# Patient Record
Sex: Male | Born: 1985 | Race: Black or African American | Hispanic: No | Marital: Single | Smoking: Former smoker
Health system: Southern US, Community
[De-identification: ages and names within clinical notes are randomized; demographics above are authoritative.]

## PROBLEM LIST (undated history)

## (undated) DIAGNOSIS — F332 Major depressive disorder, recurrent severe without psychotic features: Secondary | ICD-10-CM

---

## 2006-02-06 ENCOUNTER — Emergency Department: Payer: Self-pay | Admitting: Emergency Medicine

## 2007-12-27 ENCOUNTER — Emergency Department: Payer: Self-pay | Admitting: Emergency Medicine

## 2009-07-11 ENCOUNTER — Emergency Department: Payer: Self-pay | Admitting: Emergency Medicine

## 2009-07-20 ENCOUNTER — Emergency Department: Payer: Self-pay | Admitting: Internal Medicine

## 2009-09-27 ENCOUNTER — Emergency Department: Payer: Self-pay | Admitting: Internal Medicine

## 2010-08-02 ENCOUNTER — Emergency Department: Payer: Self-pay | Admitting: Emergency Medicine

## 2011-01-24 ENCOUNTER — Emergency Department: Payer: Self-pay | Admitting: Emergency Medicine

## 2011-04-25 ENCOUNTER — Emergency Department: Payer: Self-pay | Admitting: Emergency Medicine

## 2011-07-21 ENCOUNTER — Inpatient Hospital Stay: Payer: Self-pay | Admitting: Psychiatry

## 2011-08-05 ENCOUNTER — Emergency Department: Payer: Self-pay | Admitting: Emergency Medicine

## 2012-09-10 ENCOUNTER — Emergency Department: Payer: Self-pay | Admitting: Emergency Medicine

## 2014-09-11 ENCOUNTER — Emergency Department: Payer: Self-pay | Admitting: Emergency Medicine

## 2015-03-03 ENCOUNTER — Encounter: Payer: Self-pay | Admitting: Emergency Medicine

## 2015-03-03 ENCOUNTER — Emergency Department
Admission: EM | Admit: 2015-03-03 | Discharge: 2015-03-03 | Disposition: A | Discharge status: Home / Self Care | Payer: Managed Care, Other (non HMO) | Attending: Emergency Medicine | Admitting: Emergency Medicine

## 2015-03-03 DIAGNOSIS — R202 Paresthesia of skin: Secondary | ICD-10-CM | POA: Insufficient documentation

## 2015-03-03 DIAGNOSIS — R51 Headache: Secondary | ICD-10-CM | POA: Diagnosis present

## 2015-03-03 LAB — URINALYSIS COMPLETE WITH MICROSCOPIC (ARMC ONLY)
Bacteria, UA: NONE SEEN
Bilirubin Urine: NEGATIVE
GLUCOSE, UA: NEGATIVE mg/dL
Hgb urine dipstick: NEGATIVE
Ketones, ur: NEGATIVE mg/dL
Leukocytes, UA: NEGATIVE
Nitrite: NEGATIVE
Protein, ur: NEGATIVE mg/dL
RBC / HPF: NONE SEEN RBC/hpf (ref 0–5)
SQUAMOUS EPITHELIAL / LPF: NONE SEEN
Specific Gravity, Urine: 1.03 (ref 1.005–1.030)
pH: 6 (ref 5.0–8.0)

## 2015-03-03 LAB — COMPREHENSIVE METABOLIC PANEL
ALBUMIN: 4.5 g/dL (ref 3.5–5.0)
ALK PHOS: 80 U/L (ref 38–126)
ALT: 31 U/L (ref 17–63)
AST: 27 U/L (ref 15–41)
Anion gap: 6 (ref 5–15)
BILIRUBIN TOTAL: 0.6 mg/dL (ref 0.3–1.2)
BUN: 15 mg/dL (ref 6–20)
CALCIUM: 9.1 mg/dL (ref 8.9–10.3)
CHLORIDE: 105 mmol/L (ref 101–111)
CO2: 28 mmol/L (ref 22–32)
Creatinine, Ser: 1.24 mg/dL (ref 0.61–1.24)
GFR calc Af Amer: >60 mL/min (ref >60)
GLUCOSE: 97 mg/dL (ref 65–99)
Potassium: 3.9 mmol/L (ref 3.5–5.1)
SODIUM: 139 mmol/L (ref 135–145)
Total Protein: 8.3 g/dL — ABNORMAL HIGH (ref 6.5–8.1)

## 2015-03-03 LAB — CBC
HCT: 46.7 % (ref 40.0–52.0)
Hemoglobin: 16 g/dL (ref 13.0–18.0)
MCH: 31.1 pg (ref 26.0–34.0)
MCHC: 34.2 g/dL (ref 32.0–36.0)
MCV: 90.7 fL (ref 80.0–100.0)
PLATELETS: 271 10*3/uL (ref 150–440)
RBC: 5.14 MIL/uL (ref 4.40–5.90)
RDW: 12.5 % (ref 11.5–14.5)
WBC: 6.6 10*3/uL (ref 3.8–10.6)

## 2015-03-03 NOTE — ED Notes (Signed)
Patient to ED with report of headache for about last 2 hours, patient reports he was sitting at a show and couldn't stay awake with numbness and tingling in his hands and fingers. Patient reports no history of headaches.

## 2015-03-03 NOTE — ED Notes (Signed)
Pt called x1 for room, no answer  ?

## 2015-03-11 ENCOUNTER — Encounter: Payer: Self-pay | Admitting: Emergency Medicine

## 2015-03-11 ENCOUNTER — Emergency Department
Admission: EM | Admit: 2015-03-11 | Discharge: 2015-03-12 | Disposition: A | Discharge status: Home / Self Care | Payer: Managed Care, Other (non HMO) | Attending: Emergency Medicine | Admitting: Emergency Medicine

## 2015-03-11 ENCOUNTER — Emergency Department: Payer: Managed Care, Other (non HMO)

## 2015-03-11 DIAGNOSIS — Y998 Other external cause status: Secondary | ICD-10-CM | POA: Insufficient documentation

## 2015-03-11 DIAGNOSIS — Y9389 Activity, other specified: Secondary | ICD-10-CM | POA: Insufficient documentation

## 2015-03-11 DIAGNOSIS — Z23 Encounter for immunization: Secondary | ICD-10-CM | POA: Diagnosis not present

## 2015-03-11 DIAGNOSIS — W109XXA Fall (on) (from) unspecified stairs and steps, initial encounter: Secondary | ICD-10-CM | POA: Insufficient documentation

## 2015-03-11 DIAGNOSIS — Y92009 Unspecified place in unspecified non-institutional (private) residence as the place of occurrence of the external cause: Secondary | ICD-10-CM | POA: Diagnosis not present

## 2015-03-11 DIAGNOSIS — S0181XA Laceration without foreign body of other part of head, initial encounter: Secondary | ICD-10-CM | POA: Diagnosis not present

## 2015-03-11 DIAGNOSIS — S0990XA Unspecified injury of head, initial encounter: Secondary | ICD-10-CM

## 2015-03-11 DIAGNOSIS — S0083XA Contusion of other part of head, initial encounter: Secondary | ICD-10-CM

## 2015-03-11 MED ORDER — TRAMADOL HCL 50 MG PO TABS: 50.0000 mg | ORAL_TABLET | Freq: Three times a day (TID) | ORAL | Status: DC | PRN

## 2015-03-11 MED ORDER — TETANUS-DIPHTH-ACELL PERTUSSIS 5-2.5-18.5 LF-MCG/0.5 IM SUSP
0.5000 mL | Freq: Once | INTRAMUSCULAR | Status: AC
  Administered 2015-03-11: 0.5 mL via INTRAMUSCULAR

## 2015-03-11 MED ORDER — BACITRACIN ZINC 500 UNIT/GM EX OINT
TOPICAL_OINTMENT | CUTANEOUS | Status: AC
  Administered 2015-03-11: 1
  Filled 2015-03-11: qty 0.9

## 2015-03-11 MED ORDER — TRAMADOL HCL 50 MG PO TABS
50.0000 mg | ORAL_TABLET | Freq: Once | ORAL | Status: AC
  Administered 2015-03-11: 50 mg via ORAL

## 2015-03-11 MED ORDER — LIDOCAINE-EPINEPHRINE-TETRACAINE (LET) SOLUTION
3.0000 mL | Freq: Once | NASAL | Status: AC
  Administered 2015-03-11: 3 mL via TOPICAL

## 2015-03-11 MED ORDER — TETANUS-DIPHTH-ACELL PERTUSSIS 5-2.5-18.5 LF-MCG/0.5 IM SUSP
INTRAMUSCULAR | Status: AC
  Administered 2015-03-11: 0.5 mL via INTRAMUSCULAR
  Filled 2015-03-11: qty 0.5

## 2015-03-11 MED ORDER — LIDOCAINE-EPINEPHRINE-TETRACAINE (LET) SOLUTION
NASAL | Status: AC
  Administered 2015-03-11: 3 mL via TOPICAL
  Filled 2015-03-11: qty 3

## 2015-03-11 MED ORDER — TRAMADOL HCL 50 MG PO TABS
ORAL_TABLET | ORAL | Status: AC
  Administered 2015-03-11: 50 mg via ORAL
  Filled 2015-03-11: qty 1

## 2015-03-11 MED ORDER — LIDOCAINE HCL (PF) 1 % IJ SOLN
INTRAMUSCULAR | Status: AC
  Filled 2015-03-11: qty 5

## 2015-03-11 NOTE — ED Provider Notes (Signed)
[facility] Emergency Department Provider Note  ____________________________________________  Time seen: Approximately 10:56 PM  I have reviewed the triage vital signs and the nursing notes.   HISTORY  Chief Complaint Laceration   HPI Erik Raymond is a 29 y.o. male presents to ER with family for complaint of laceration. Patient and family reports patient was going down the stairs and his home and tripped over nephew's toy truck falling down stairs. Witnessed by family member. Reports fell and hit face on railing. Patient and family report patient with loss of consciousness 2 seconds. Reports he was easily arousable and has been acting normal since. Reports fall occurred at approximately 6 PM. Denies vomiting, vision changes, neck or back pain.  Complains of pain at laceration site at mid forehead as well as mild headache. Denies other pain or injury.States stairs are carpeted and fell only "a few" stairs. Describes pain as 5 out of 10 aching. Denies changes with movement, light or sound.   History reviewed. No pertinent past medical history.  There are no active problems to display for this patient.   History reviewed. No pertinent past surgical history.  No current outpatient prescriptions on file. Unsure of last tetanus immunization. Allergies Review of patient's allergies indicates no known allergies.  History reviewed. No pertinent family history.  Social History History  Substance Use Topics  . Smoking status: Never Smoker   . Smokeless tobacco: Not on file  . Alcohol Use: No    Review of Systems Constitutional: No fever/chills Eyes: No visual changes. ENT: No sore throat. Cardiovascular: Denies chest pain. Respiratory: Denies shortness of breath. Gastrointestinal: No abdominal pain.  No nausea, no vomiting.  No diarrhea.  No constipation. Genitourinary: Negative for dysuria. Musculoskeletal: Negative for back pain. Skin: Negative for  rash. Neurological: Negative for headaches, focal weakness or numbness.  10-point ROS otherwise negative.  ____________________________________________   PHYSICAL EXAM:  VITAL SIGNS: ED Triage Vitals  Enc Vitals Group     BP 03/11/15 1820 135/78 mmHg     Pulse Rate 03/11/15 1820 77     Resp 03/11/15 1820 18     Temp 03/11/15 1820 98.2 F (36.8 C)     Temp Source 03/11/15 1820 Oral     SpO2 03/11/15 1820 97 %     Weight 03/11/15 1820 194 lb (87.998 kg)     Height 03/11/15 1820  (1.778 m)     Head Cir --      Peak Flow --      Pain Score 03/11/15 1826 10     Pain Loc --      Pain Edu? --      Excl. in GC? --     Constitutional: Alert and oriented. Well appearing and in no acute distress. Eyes: Conjunctivae are normal. PERRL. EOMI. No pain with EOMs.  Head: mod TTP mid lower forehead and right superior and medial orbit. Mild swelling, laceration present. NO ecchymosis.. Ears: no erythema, normal TMs.  Nose: No congestion/rhinnorhea. Mouth/Throat: Mucous membranes are moist.  Oropharynx non-erythematous. Neck: No stridor.  No cervical spine tenderness to palpation. Hematological/Lymphatic/Immunilogical: No cervical lymphadenopathy. Cardiovascular: Normal rate, regular rhythm. Grossly normal heart sounds.  Good peripheral circulation. Respiratory: Normal respiratory effort.  No retractions. Lungs CTAB. Gastrointestinal: Soft and nontender. No distention. No abdominal bruits. No CVA tenderness. Musculoskeletal: No lower extremity tenderness nor edema.  No joint effusions. No pain to bilateral upper or lower extremities. Steady gait. Changes positions quickly from lying to standing without  pain or distress. No neck or back pain. Neurologic:  Normal speech and language. No gross focal neurologic deficits are appreciated. Speech is normal. No gait instability. Cranial nerve II through XII grossly intact. GCS 15. Skin:  Skin is warm, dry and intact. No rash noted. Except 1.5  cm laceration jagged mid lower forehead. Mod TTP.  Psychiatric: Mood and affect are normal. Speech and behavior are normal.  ____________________________________________   _______________________________________  RADIOLOGY  CT HEAD WITHOUT CONTRAST  CT MAXILLOFACIAL WITHOUT CONTRAST  TECHNIQUE: Multidetector CT imaging of the head and maxillofacial structures were performed using the standard protocol without intravenous contrast. Multiplanar CT image reconstructions of the maxillofacial structures were also generated.  COMPARISON: None.  FINDINGS: CT HEAD FINDINGS  No intracranial hemorrhage, mass effect, or midline shift. No hydrocephalus. The basilar cisterns are patent. No evidence of territorial infarct. No intracranial fluid collection. Calvarium is intact. The mastoid air cells are well aerated.  CT MAXILLOFACIAL FINDINGS  There is no facial bone fracture. The orbits and globes are intact. The right third molar is displaced within the maxillary sinus and associated with a large peripherally calcified thin walled lesion near completely filling the maxillary sinus. Minimal mucosal thickening of the ethmoid air cells, remaining paranasal sinuses are well-aerated. The mandibles, pterygoid plates, and zygomatic arches are intact.  IMPRESSION: 1. No acute intracranial abnormality. 2. No acute facial bone fracture. 3. Right third molar is displaced within the maxillary sinus associated with a peripherally calcified thin walled lesion near completely replacing the right maxillary sinus. 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 Recommend nonemergent further evaluation with contrast-enhanced MRI.   Electronically Signed By: Rubye OaksMelanie Ehinger M.D. On: 03/11/2015 23:28 ____________________________________________   PROCEDURES  Procedure(s) performed: LACERATION REPAIR Performed by: Renford DillsLindsey Gavin Telford Authorized by: Renford DillsLindsey Ken[place]ha Doster Consent: Verbal  consent obtained. Risks and benefits: risks, benefits and alternatives were discussed Consent given by: patient Patient identity confirmed: provided demographic data Prepped and Draped in normal sterile fashion Wound explored  Laceration Location: mid forehead  Laceration Length: 1.5 cm  No Foreign Bodies seen or palpated  Anesthesia: local infiltration  Local anesthetic: lidocaine 1%  Anesthetic total: 3 ml  Irrigation method: syringe Amount of cleaning: standard  Skin closure: 6-0 nylon  Number of sutures: 3  Technique: simple interrupted  Patient tolerance: Patient tolerated the procedure well with no immediate complications. ____________________________________________   INITIAL IMPRESSION / ASSESSMENT AND PLAN / ED COURSE  Pertinent labs & imaging results that were available during my care of the patient were reviewed by me and considered in my medical decision making (see chart for details).  Well-appearing patient. Presents to the ER with family with laceration to mid forehead post mechanical fall downstairs. No focal neurological deficits. Ambulatory with steady gait.  2340: CT head and maxillary facial negative for acute intracranial abnormality, no acute facial bone fracture. 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 Discussed these findings with patient and family. Patient to follow-up with primary care physician as well as dentist in regards to these incidental findings. Patient verbalized understanding and agreed to plan.  Again CT had a maxillary facial negative for acute injury. Patient to rest no contact sports for at least 2 weeks. Follow up with primary care physician this week. Return to the ER in 7 days for suture removal. Discussed follow-up and  return parameters.  ____________________________________________   FINAL CLINICAL IMPRESSION(S) / ED DIAGNOSES  Final diagnoses:  Head injury, initial encounter  Facial contusion, initial encounter  Facial laceration, initial encounter      Renford Dills, NP 03/12/15 0013  Sharyn Creamer, MD 03/12/15 [phone]

## 2015-03-11 NOTE — Discharge Instructions (Signed)
Rest. Take medication as prescribed as needed take over-the-counter Tylenol or ibuprofen as needed for mild pain. Apply ice. Rest. No strenuous activity or contact sports for at least 2 weeks. Follow up with your primary care physician prior to resuming full activity.  As discussed follow-up with your primary care physician or dentist next week regarding Ct findings.  Return to the ER in 7 days for suture removal.  Return to the ER sooner for new or worsening concerns.   Contusion A contusion is a deep bruise. Contusions are the result of an injury that caused bleeding under the skin. The contusion may turn blue, purple, or yellow. Minor injuries will give you a painless contusion, but more severe contusions may stay painful and swollen for a few weeks.  CAUSES  A contusion is usually caused by a blow, trauma, or direct force to an area of the body. SYMPTOMS   Swelling and redness of the injured area.  Bruising of the injured area.  Tenderness and soreness of the injured area.  Pain. DIAGNOSIS  The diagnosis can be made by taking a history and physical exam. An X-ray, CT scan, or MRI may be needed to determine if there were any associated injuries, such as fractures. TREATMENT  Specific treatment will depend on what area of the body was injured. In general, the best treatment for a contusion is resting, icing, elevating, and applying cold compresses to the injured area. Over-the-counter medicines may also be recommended for pain control. Ask your caregiver what the best treatment is for your contusion. HOME CARE INSTRUCTIONS   Put ice on the injured area.  Put ice in a plastic bag.  Place a towel between your skin and the bag.  Leave the ice on for 15-20 minutes, 3-4 times a day, or as directed by your health care provider.  Only take over-the-counter or prescription medicines for pain, discomfort, or fever as directed by your caregiver. Your caregiver may recommend avoiding  anti-inflammatory medicines (aspirin, ibuprofen, and naproxen) for 48 hours because these medicines may increase bruising.  Rest the injured area.  If possible, elevate the injured area to reduce swelling. SEEK IMMEDIATE MEDICAL CARE IF:   You have increased bruising or swelling.  You have pain that is getting worse.  Your swelling or pain is not relieved with medicines. MAKE SURE YOU:   Understand these instructions.  Will watch your condition.  Will get help right away if you are not doing well or get worse. Document Released: 07/09/2005 Document Revised: 10/04/2013 Document Reviewed: 08/04/2011 [facility] Patient Information 2015 [place], [place]. This information is not intended to replace advice given to you by your health care provider. Make sure you discuss any questions you have with your health care provider.  Facial or Scalp Contusion  A facial or scalp contusion is a deep bruise on the face or head. Contusions happen when an injury causes bleeding under the skin. Signs of bruising include pain, puffiness (swelling), and discolored skin. The contusion may turn blue, purple, or yellow. HOME CARE  Only take medicines as told by your doctor.  Put ice on the injured area.  Put ice in a plastic bag.  Place a towel between your skin and the bag.  Leave the ice on for 20 minutes, 2-3 times a day. GET HELP IF:  You have bite problems.  You have pain when chewing.  You are worried about your face not healing normally. GET HELP RIGHT AWAY IF:   You have severe  pain or a headache and medicine does not help.  You are very tired or confused, or your personality changes.  You throw up (vomit).  You have a nosebleed that will not stop.  You see two of everything (double vision) or have blurry vision.  You have fluid coming from your nose or ear.  You have problems walking or using your arms or legs. MAKE SURE YOU:   Understand these instructions.  Will watch your  condition.  Will get help right away if you are not doing well or get worse. Document Released: 09/18/2011 Document Revised: 07/20/2013 Document Reviewed: 05/12/2013 [facility] Patient Information 2015 [place]. This information is not intended to replace advice given to you by your health care provider. Make sure you discuss any questions you have with your health care provider.  Facial Laceration A facial laceration is a cut on the face. These injuries can be painful and cause bleeding. Some cuts may need to be closed with stitches (sutures), skin adhesive strips, or wound glue. Cuts usually heal quickly but can leave a scar. It can take 1-2 years for the scar to go away completely. HOME CARE   Only take medicines as told by your doctor.  Follow your doctor's instructions for wound care. For Stitches:  Keep the cut clean and dry.  If you have a bandage (dressing), change it at least once a day. Change the bandage if it gets wet or dirty, or as told by your doctor.  Wash the cut with soap and water 2 times a day. Rinse the cut with water. Pat it dry with a clean towel.  Put a thin layer of medicated cream on the cut as told by your doctor.  You may shower after the first 24 hours. Do not soak the cut in water until the stitches are removed.  Have your stitches removed as told by your doctor.  Do not wear any makeup until a few days after your stitches are removed. For Skin Adhesive Strips:  Keep the cut clean and dry.  Do not get the strips wet. You may take a bath, but be careful to keep the cut dry.  If the cut gets wet, pat it dry with a clean towel.  The strips will fall off on their own. Do not remove the strips that are still stuck to the cut. For Wound Glue:  You may shower or take baths. Do not soak or scrub the cut. Do not swim. Avoid heavy sweating until the glue falls off on its own. After a shower or bath, pat the cut dry with a clean towel.  Do not put  medicine or makeup on your cut until the glue falls off.  If you have a bandage, do not put tape over the glue.  Avoid lots of sunlight or tanning lamps until the glue falls off.  The glue will fall off on its own in 5-10 days. Do not pick at the glue. After Healing: Put sunscreen on the cut for the first year to reduce your scar. GET HELP RIGHT AWAY IF:   Your cut area gets red, painful, or puffy (swollen).  You see a yellowish-white fluid (pus) coming from the cut.  You have chills or a fever. MAKE SURE YOU:   Understand these instructions.  Will watch your condition.  Will get help right away if you are not doing well or get worse. Document Released: 03/17/2008 Document Revised: 07/20/2013 Document Reviewed: 05/12/2013 [employer] Patient Information 2015  [employer]. This information is not intended to replace advice given to you by your health care provider. Make sure you discuss any questions you have with your health care provider.  Head Injury You have received a head injury. It does not appear serious at this time. Headaches and vomiting are common following head injury. It should be easy to awaken from sleeping. Sometimes it is necessary for you to stay in the emergency department for a while for observation. Sometimes admission to the hospital may be needed. After injuries such as yours, most problems occur within the first 24 hours, but side effects may occur up to 7-10 days after the injury. It is important for you to carefully monitor your condition and contact your health care provider or seek immediate medical care if there is a change in your condition. WHAT ARE THE TYPES OF HEAD INJURIES? Head injuries can be as minor as a bump. Some head injuries can be more severe. More severe head injuries include:  A jarring injury to the brain (concussion).  A bruise of the brain (contusion). This mean there is bleeding in the brain that can cause swelling.  A cracked skull  (skull fracture).  Bleeding in the brain that collects, clots, and forms a bump (hematoma). WHAT CAUSES A HEAD INJURY? A serious head injury is most likely to happen to someone who is in a car wreck and is not wearing a seat belt. Other causes of major head injuries include bicycle or motorcycle accidents, sports injuries, and falls. HOW ARE HEAD INJURIES DIAGNOSED? A complete history of the event leading to the injury and your current symptoms will be helpful in diagnosing head injuries. Many times, pictures of the brain, such as CT or MRI are needed to see the extent of the injury. Often, an overnight hospital stay is necessary for observation.  WHEN SHOULD I SEEK IMMEDIATE MEDICAL CARE?  You should get help right away if:  You have confusion or drowsiness.  You feel sick to your stomach (nauseous) or have continued, forceful vomiting.  You have dizziness or unsteadiness that is getting worse.  You have severe, continued headaches not relieved by medicine. Only take over-the-counter or prescription medicines for pain, fever, or discomfort as directed by your health care provider.  You do not have normal function of the arms or legs or are unable to walk.  You notice changes in the black spots in the center of the colored part of your eye (pupil).  You have a clear or bloody fluid coming from your nose or ears.  You have a loss of vision. During the next 24 hours after the injury, you must stay with someone who can watch you for the warning signs. This person should contact local emergency services (911 in the [place] if you have seizures, you become unconscious, or you are unable to wake up. HOW CAN I PREVENT A HEAD INJURY IN THE FUTURE? The most important factor for preventing major head injuries is avoiding motor vehicle accidents. To minimize the potential for damage to your head, it is crucial to wear seat belts while riding in motor vehicles. Wearing helmets while bike riding and  playing collision sports (like football) is also helpful. Also, avoiding dangerous activities around the house will further help reduce your risk of head injury.  WHEN CAN I RETURN TO NORMAL ACTIVITIES AND ATHLETICS? You should be reevaluated by your health care provider before returning to these activities. If you have any of the following  symptoms, you should not return to activities or contact sports until 1 week after the symptoms have stopped:  Persistent headache.  Dizziness or vertigo.  Poor attention and concentration.  Confusion.  Memory problems.  Nausea or vomiting.  Fatigue or tire easily.  Irritability.  Intolerant of bright lights or loud noises.  Anxiety or depression.  Disturbed sleep. MAKE SURE YOU:   Understand these instructions.  Will watch your condition.  Will get help right away if you are not doing well or get worse. Document Released: 09/29/2005 Document Revised: 10/04/2013 Document Reviewed: 06/06/2013 [facility] Patient Information 2015 [place], [place]. This information is not intended to replace advice given to you by your health care provider. Make sure you discuss any questions you have with your health care provider.

## 2015-03-11 NOTE — ED Notes (Signed)
Pt reports that he was going down the stairs and hit his on the railing. Family reports that he blacked out when it happened. Pt is alert and oriented at this time. There is a small laceration at the bridge of his nose. Bleeding is under control at this time.

## 2015-03-17 ENCOUNTER — Emergency Department
Admission: EM | Admit: 2015-03-17 | Discharge: 2015-03-17 | Disposition: A | Discharge status: Home / Self Care | Payer: Managed Care, Other (non HMO) | Attending: Student | Admitting: Student

## 2015-03-17 ENCOUNTER — Encounter: Payer: Self-pay | Admitting: Emergency Medicine

## 2015-03-17 DIAGNOSIS — Z4802 Encounter for removal of sutures: Secondary | ICD-10-CM | POA: Insufficient documentation

## 2015-03-17 NOTE — ED Provider Notes (Signed)
[facility] Emergency Department Provider Note  ____________________________________________  Time seen: 1752  I have reviewed the triage vital signs and the nursing notes.   HISTORY  Chief Complaint Suture / Staple Removal    HPI Erik Raymond is a 29 y.o. male here for suture removal. He denies having any problems. No drainage or redness from the area. 3 sutures were placed in his forehead on 03/12/2015. He denies any pain   History reviewed. No pertinent past medical history.  There are no active problems to display for this patient.   History reviewed. No pertinent past surgical history.  Current Outpatient Rx  Name  Route  Sig  Dispense  Refill  . traMADol (ULTRAM) 50 MG tablet   Oral   Take 1 tablet (50 mg total) by mouth every 8 (eight) hours as needed (Do not drive or operate machinery while taking as can cause drowsiness.).   12 tablet   0     Allergies Review of patient's allergies indicates no known allergies.  History reviewed. No pertinent family history.  Social History History  Substance Use Topics  . Smoking status: Never Smoker   . Smokeless tobacco: Not on file  . Alcohol Use: No    Review of Systems Constitutional: No fever/chills Eyes: No visual changes. ENT: No sore throat. Cardiovascular: Denies chest pain. Respiratory: Denies shortness of breath. Gastrointestinal: No abdominal pain.  No nausea, no vomiting.  No diarrhea.  No constipation. Genitourinary: Negative for dysuria. Musculoskeletal: Negative for back pain. Skin: Negative for rash. Neurological: Negative for headaches, focal weakness or numbness.  10-point ROS otherwise negative.  ____________________________________________   PHYSICAL EXAM:  VITAL SIGNS: ED Triage Vitals  Enc Vitals Group     BP 03/17/15 1727 125/56 mmHg     Pulse Rate 03/17/15 1727 55     Resp 03/17/15 1727 18     Temp 03/17/15 1727 98.1 F (36.7 C)     Temp Source  03/17/15 1727 Oral     SpO2 03/17/15 1727 98 %     Weight 03/17/15 1727 194 lb (87.998 kg)     Height 03/17/15 1727 5\' 10"  (1.778 m)     Head Cir --      Peak Flow --      Pain Score 03/17/15 1728 0     Pain Loc --      Pain Edu? --      Excl. in GC? --     Constitutional: Alert and oriented. Well appearing and in no acute distress. Eyes: Conjunctivae are normal. PERRL. EOMI. Head: Atraumatic. Nose: No congestion/rhinnorhea. Neck: No stridor.   Respiratory: Normal respiratory effort.  No retractions. Neurologic:  Normal speech and language. No gross focal neurologic deficits are appreciated. Speech is normal. No gait instability. Skin:  Skin is warm, dry and  suture line well healed without evidence of infection Psychiatric: Mood and affect are normal. Speech and behavior are normal.  ____________________________________________   LABS (all labs ordered are listed, but only abnormal results are displayed)  Labs Reviewed - No data to display  PROCEDURES  Procedure(s) performed: None  Critical Care performed: No  ____________________________________________   INITIAL IMPRESSION / ASSESSMENT AND PLAN / ED COURSE  Pertinent labs & imaging results that were available during my care of the patient were reviewed by me and considered in my medical decision making (see chart for details).  Patient is return if any problems with infection or wound site. ____________________________________________   FINAL CLINICAL  IMPRESSION(S) / ED DIAGNOSES  Final diagnoses:  Encounter for removal of sutures      Tommi Rumps, PA-C 03/18/15 0006  Gayla Doss, MD 03/18/15 [phone]

## 2015-03-17 NOTE — Discharge Instructions (Signed)

## 2015-03-17 NOTE — ED Notes (Signed)
Here to have sutures in forehead removed

## 2015-03-21 ENCOUNTER — Emergency Department
Admission: EM | Admit: 2015-03-21 | Discharge: 2015-03-21 | Disposition: A | Discharge status: Home / Self Care | Payer: Managed Care, Other (non HMO) | Attending: Student | Admitting: Student

## 2015-03-21 ENCOUNTER — Encounter: Payer: Self-pay | Admitting: Emergency Medicine

## 2015-03-21 DIAGNOSIS — F513 Sleepwalking [somnambulism]: Secondary | ICD-10-CM | POA: Insufficient documentation

## 2015-03-21 DIAGNOSIS — R51 Headache: Secondary | ICD-10-CM | POA: Diagnosis not present

## 2015-03-21 DIAGNOSIS — Z046 Encounter for general psychiatric examination, requested by authority: Secondary | ICD-10-CM | POA: Diagnosis present

## 2015-03-21 LAB — CBC
HCT: 46 % (ref 40.0–52.0)
Hemoglobin: 15.4 g/dL (ref 13.0–18.0)
MCH: 30.4 pg (ref 26.0–34.0)
MCHC: 33.4 g/dL (ref 32.0–36.0)
MCV: 91.2 fL (ref 80.0–100.0)
Platelets: 256 10*3/uL (ref 150–440)
RBC: 5.05 MIL/uL (ref 4.40–5.90)
RDW: 12.6 % (ref 11.5–14.5)
WBC: 4.7 10*3/uL (ref 3.8–10.6)

## 2015-03-21 LAB — COMPREHENSIVE METABOLIC PANEL
ALK PHOS: 71 U/L (ref 38–126)
ALT: 30 U/L (ref 17–63)
AST: 28 U/L (ref 15–41)
Albumin: 4.2 g/dL (ref 3.5–5.0)
Anion gap: 6 (ref 5–15)
BILIRUBIN TOTAL: 1 mg/dL (ref 0.3–1.2)
BUN: 15 mg/dL (ref 6–20)
CO2: 26 mmol/L (ref 22–32)
Calcium: 8.9 mg/dL (ref 8.9–10.3)
Chloride: 106 mmol/L (ref 101–111)
Creatinine, Ser: 1.22 mg/dL (ref 0.61–1.24)
GFR calc Af Amer: >60 mL/min (ref >60)
GFR calc non Af Amer: >60 mL/min (ref >60)
Glucose, Bld: 99 mg/dL (ref 65–99)
Potassium: 4 mmol/L (ref 3.5–5.1)
Sodium: 138 mmol/L (ref 135–145)
TOTAL PROTEIN: 7.7 g/dL (ref 6.5–8.1)

## 2015-03-21 LAB — SALICYLATE LEVEL: Salicylate Lvl: <4 mg/dL (ref 2.8–30.0)

## 2015-03-21 LAB — URINE DRUG SCREEN, QUALITATIVE (ARMC ONLY)
AMPHETAMINES, UR SCREEN: NOT DETECTED
Barbiturates, Ur Screen: NOT DETECTED
Benzodiazepine, Ur Scrn: NOT DETECTED
CANNABINOID 50 NG, UR ~~LOC~~: NOT DETECTED
Cocaine Metabolite,Ur ~~LOC~~: NOT DETECTED
MDMA (ECSTASY) UR SCREEN: NOT DETECTED
Methadone Scn, Ur: NOT DETECTED
Opiate, Ur Screen: NOT DETECTED
Phencyclidine (PCP) Ur S: NOT DETECTED
Tricyclic, Ur Screen: NOT DETECTED

## 2015-03-21 LAB — ACETAMINOPHEN LEVEL: Acetaminophen (Tylenol), Serum: <10 ug/mL — ABNORMAL LOW (ref 10–30)

## 2015-03-21 LAB — ETHANOL

## 2015-03-21 NOTE — ED Provider Notes (Signed)
[facility] Emergency Department Provider Note  ____________________________________________  Time seen: Approximately 4:27 PM  I have reviewed the triage vital signs and the nursing notes.   HISTORY  Chief Complaint Psychiatric Evaluation    HPI Khylon Davies is a 29 y.o. male with no chronic medical problems presents for evaluation of sleepwalking which has been going on for 4 years. He reports that it is happening with increased frequency. Today his mother was concerned that "he was going crazy". He did not physically assault anyone in his sleep, he did not verbally assault anyone in his sleep he does not know what he was doing that made everyone so nervous. He will not allow me to speak with his mother to obtain additional history about what she saw that made her so concerned. He reports that sometimes when he is waking up from sleep, he will see flashes of color but otherwise he denies any audiovisual hallucinations, no SI, no HI. He doesn't endorse any specific depression symptoms to me but reports that he "hasn't been the same" since several of his family members died approximately 4 years ago. This sleepwalking happens intermittently. The severity is moderate. No modifying factors. He denies any chest pain, difficulty breathing, nausea, vomiting, diarrhea. He had a mechanical fall with head injury at the end of May with a negative CT of his head. He has had mild intermittentheadaches since that fall.   History reviewed. No pertinent past medical history.  There are no active problems to display for this patient.   History reviewed. No pertinent past surgical history.  Current Outpatient Rx  Name  Route  Sig  Dispense  Refill  . traMADol (ULTRAM) 50 MG tablet   Oral   Take 1 tablet (50 mg total) by mouth every 8 (eight) hours as needed (Do not drive or operate machinery while taking as can cause drowsiness.).   12 tablet   0     Allergies Review of  patient's allergies indicates no known allergies.  No family history on file.  Social History History  Substance Use Topics  . Smoking status: Never Smoker   . Smokeless tobacco: Not on file  . Alcohol Use: No    Review of Systems Constitutional: No fever/chills Eyes: No visual changes. ENT: No sore throat. Cardiovascular: Denies chest pain. Respiratory: Denies shortness of breath. Gastrointestinal: No abdominal pain.  No nausea, no vomiting.  No diarrhea.  No constipation. Genitourinary: Negative for dysuria. Musculoskeletal: Negative for back pain. Skin: Negative for rash. Neurological: Positive for mild  Headaches, no focal weakness or numbness.  10-point ROS otherwise negative.  ____________________________________________   PHYSICAL EXAM:  VITAL SIGNS: ED Triage Vitals  Enc Vitals Group     BP 03/21/15 1557 135/69 mmHg     Pulse Rate 03/21/15 1557 53     Resp 03/21/15 1557 16     Temp 03/21/15 1557 98.3 F (36.8 C)     Temp Source 03/21/15 1557 Oral     SpO2 03/21/15 1557 96 %     Weight 03/21/15 1557 194 lb (87.998 kg)     Height 03/21/15 1557  (1.778 m)     Head Cir --      Peak Flow --      Pain Score 03/21/15 1602 0     Pain Loc --      Pain Edu? --      Excl. in GC? --     Constitutional: Alert and oriented. Well appearing  and in no acute distress. Eyes: Conjunctivae are normal. PERRL. EOMI. Head: Well healing scar to the forehead where sutures were removed but head is otherwise atraumatic. Nose: No congestion/rhinnorhea. Mouth/Throat: Mucous membranes are moist.  Oropharynx non-erythematous. Neck: No stridor.  Cardiovascular: Normal rate, regular rhythm. Grossly normal heart sounds.  Good peripheral circulation. Respiratory: Normal respiratory effort.  No retractions. Lungs CTAB. Gastrointestinal: Soft and nontender. No distention. No abdominal bruits. No CVA tenderness. Genitourinary: deferred Musculoskeletal: No lower extremity  tenderness nor edema.  No joint effusions. Neurologic:  Normal speech and language. No gross focal neurologic deficits are appreciated. Speech is normal. No gait instability. 5 out of 5 strength in bilateral upper and lower extremities, sensation intact to light touch throughout. Cranial nerves II through XII intact. Normal ambulation. Skin:  Skin is warm, dry and intact. No rash noted. Psychiatric: Mood and affect are normal. Speech and behavior are normal.  ____________________________________________   LABS (all labs ordered are listed, but only abnormal results are displayed)  Labs Reviewed  CBC  COMPREHENSIVE METABOLIC PANEL  URINE DRUG SCREEN, QUALITATIVE ([facility] ONLY)  ACETAMINOPHEN LEVEL  ETHANOL  SALICYLATE LEVEL   ____________________________________________  EKG  none ____________________________________________  RADIOLOGY  none ____________________________________________   PROCEDURES  Procedure(s) performed: None  Critical Care performed: No  ____________________________________________   INITIAL IMPRESSION / ASSESSMENT AND PLAN / ED COURSE  Pertinent labs & imaging results that were available during my care of the patient were reviewed by me and considered in my medical decision making (see chart for details).  Scharlene GlossMario Raymond is a 29 y.o. male with no chronic medical problems presents for evaluation of sleepwalking which has been going on for 4 years. On exam, he is very well-appearing and in no acute distress. Vital signs stable. He is afebrile. He has an intact neurological examination. No SI, no HI, no audiovisual hallucinations. His symptoms of been ongoing for several years. He had a negative CT scan of his head on 03/11/2015, no need for repeat head imaging. I discussed need for close PCP follow-up, will also discharge with [facility] information. DC with return precautions. Labs reviewed and are unremarkable. He is comfortable with the discharge  plan. ____________________________________________   FINAL CLINICAL IMPRESSION(S) / ED DIAGNOSES  Final diagnoses:  Sleep walkings      Gayla DossEryka A Fortunato Nordin, MD 03/21/15 [phone]

## 2015-03-21 NOTE — ED Notes (Addendum)
Pt alert and oriented X4, active, cooperative, pt in NAD. RR even and unlabored, color WNL.  Pt states that he has been sleep walking, hx of same. States that he sees colors when awaking from sleep walking. Pt wants MD evaluation because his family is making him concerned that he "is going crazy". Denies SI, denies HI, denies depression.

## 2015-03-21 NOTE — [facility] Counselor (Signed)
Per ER MD (Dr. Inocencio HomesGayle) the pt. doesn't need to be seen by Behavioral Medicine.

## 2015-03-21 NOTE — ED Notes (Signed)
Patient assigned to appropriate care area. Patient oriented to unit/care area: Informed that, for their safety, care areas are designed for safety and monitored by security cameras at all times; and visiting hours explained to patient. Patient verbalizes understanding, and verbal contract for safety obtained. 

## 2015-03-21 NOTE — ED Notes (Addendum)
Pt reports depression. Denies SI at this time. States thoughts in the past. Pt reports seeing things but cant really explain it pt. Pt states this has been going on for about 2 months

## 2015-03-21 NOTE — Discharge Instructions (Signed)
Follow-up with a primary care doctor as soon as possible. Follow-up [facility] for mild depressive symptoms, as needed. Return immediately to the emergency department for severe or worsening symptoms including severe headache, numbness, weakness, confusion, chest pain, difficulty breathing, fevers, having thoughts of wanting to hurt or kill yourself or hurt or kill anyone else, if you're having hallucinations, or for any other concerns.

## 2015-03-21 NOTE — ED Notes (Signed)

## 2015-03-21 NOTE — ED Notes (Signed)
MD at bedside. 

## 2015-03-21 NOTE — ED Notes (Signed)
Pt alert and oriented X4, active, cooperative, pt in NAD. RR even and unlabored, color WNL.  Pt informed to return if any life threatening symptoms occur.   

## 2015-03-21 NOTE — ED Notes (Signed)
BEHAVIORAL HEALTH ROUNDING Patient sleeping: No. Patient alert and oriented: yes Behavior appropriate: Yes.  ;  Nutrition and fluids offered: Yes  Toileting and hygiene offered: Yes  Sitter present: yes Law enforcement present: Yes  

## 2015-03-22 ENCOUNTER — Emergency Department
Admission: EM | Admit: 2015-03-22 | Discharge: 2015-03-22 | Disposition: A | Discharge status: Home / Self Care | Payer: Managed Care, Other (non HMO)

## 2015-04-10 ENCOUNTER — Ambulatory Visit: Payer: Managed Care, Other (non HMO) | Admitting: Neurology

## 2015-04-18 ENCOUNTER — Encounter: Payer: Self-pay | Admitting: Neurology

## 2015-08-07 ENCOUNTER — Emergency Department
Admission: EM | Admit: 2015-08-07 | Discharge: 2015-08-07 | Disposition: A | Discharge status: Home / Self Care | Payer: Managed Care, Other (non HMO) | Attending: Emergency Medicine | Admitting: Emergency Medicine

## 2015-08-07 ENCOUNTER — Encounter: Payer: Self-pay | Admitting: *Deleted

## 2015-08-07 DIAGNOSIS — J029 Acute pharyngitis, unspecified: Secondary | ICD-10-CM | POA: Diagnosis present

## 2015-08-07 DIAGNOSIS — R070 Pain in throat: Secondary | ICD-10-CM

## 2015-08-07 DIAGNOSIS — J358 Other chronic diseases of tonsils and adenoids: Secondary | ICD-10-CM

## 2015-08-07 LAB — POCT RAPID STREP A: Streptococcus, Group A Screen (Direct): NEGATIVE

## 2015-08-07 MED ORDER — BENZOCAINE 20 % MT SOLN
Freq: Once | OROMUCOSAL | Status: AC
  Administered 2015-08-07: 2 via OROMUCOSAL
  Filled 2015-08-07: qty 10

## 2015-08-07 MED ORDER — DEXAMETHASONE SODIUM PHOSPHATE 10 MG/ML IJ SOLN
INTRAMUSCULAR | Status: AC
  Administered 2015-08-07: 10 mg
  Filled 2015-08-07: qty 1

## 2015-08-07 MED ORDER — DEXAMETHASONE 1 MG/ML PO CONC: 10.0000 mg | Freq: Once | ORAL | Status: DC

## 2015-08-07 NOTE — ED Notes (Signed)
Pt says he feels like his tonsils are swelling and he is having difficulty swallowing, denies pain or fevers. Symptoms for three days. No obvious distress in triage.

## 2015-08-07 NOTE — Discharge Instructions (Signed)
You were diagnosed with tonsillar debris. Please follow up with ENT for further evaluation.

## 2015-08-07 NOTE — ED Provider Notes (Signed)
[facility] Emergency Department Provider Note  ____________________________________________  Time seen: Approximately 0037 AM  I have reviewed the triage vital signs and the nursing notes.   HISTORY  Chief Complaint Sore Throat    HPI Erik Raymond is a 29 y.o. male who comes in today reporting that he can feel his tonsils. He reports that it does not hurt but he reports that he has been having some trouble swallowing food and feels as though his tonsils are swollen. The patient reports that this going on for the last 3 days. He has never had any problems with his throat in the past. The patient is unsure if he snores did not have any fever or cough or runny nose chest or abdominal pain. The patient was concerned as this was continuing so he decided to come in for evaluation. The patient did not take any medication for the symptoms.   History reviewed. No pertinent past medical history.  There are no active problems to display for this patient.   History reviewed. No pertinent past surgical history.  Current Outpatient Rx  Name  Route  Sig  Dispense  Refill  . traMADol (ULTRAM) 50 MG tablet   Oral   Take 1 tablet (50 mg total) by mouth every 8 (eight) hours as needed (Do not drive or operate machinery while taking as can cause drowsiness.).   12 tablet   0     Allergies Review of patient's allergies indicates no known allergies.  No family history on file.  Social History Social History  Substance Use Topics  . Smoking status: Never Smoker   . Smokeless tobacco: None  . Alcohol Use: No    Review of Systems Constitutional: No fever/chills Eyes: No visual changes. ENT: Throat Discomfort Cardiovascular: Denies chest pain. Respiratory: Denies shortness of breath. Gastrointestinal: No abdominal pain.  No nausea, no vomiting.  No diarrhea.  No constipation. Genitourinary: Negative for dysuria. Musculoskeletal: Negative for back pain. Skin:  Negative for rash. Neurological: Negative for headaches, focal weakness or numbness.  10-point ROS otherwise negative.  ____________________________________________   PHYSICAL EXAM:  VITAL SIGNS: ED Triage Vitals  Enc Vitals Group     BP 08/07/15 0011 153/64 mmHg     Pulse Rate 08/07/15 0011 67     Resp 08/07/15 0011 16     Temp 08/07/15 0011 98.2 F (36.8 C)     Temp src --      SpO2 08/07/15 0011 98 %     Weight 08/07/15 0011 205 lb (92.987 kg)     Height 08/07/15 0011 5\' 10"  (1.778 m)     Head Cir --      Peak Flow --      Pain Score 08/07/15 0011 0     Pain Loc --      Pain Edu? --      Excl. in GC? --     Constitutional: Alert and oriented. Well appearing and in no acute distress. Eyes: Conjunctivae are normal. PERRL. EOMI. Head: Atraumatic. Nose: No congestion/rhinnorhea. Mouth/Throat: Mucous membranes are moist.  Oropharynx non-erythematous. Tonsillar debris noted on left tonsil Hematological/Lymphatic/Immunilogical: Anterior cervical lymphadenopathy. Cardiovascular: Normal rate, regular rhythm. Grossly normal heart sounds.  Good peripheral circulation. Respiratory: Normal respiratory effort.  No retractions. Lungs CTAB. Gastrointestinal: Soft and nontender. No distention. Positive bowel sounds Musculoskeletal: No lower extremity tenderness nor edema.   Neurologic:  Normal speech and language. No gross focal neurologic deficits are appreciated.  Skin:  Skin is warm,  dry and intact. No rash noted. Psychiatric: Mood and affect are normal. Speech and behavior are normal.  ____________________________________________   LABS (all labs ordered are listed, but only abnormal results are displayed)  Labs Reviewed  CULTURE, GROUP A STREP ([facility] ONLY)  POCT RAPID STREP A    ____________________________________________  EKG  none ____________________________________________  RADIOLOGY  none ____________________________________________   PROCEDURES  Procedure(s) performed: None  Critical Care performed: No  ____________________________________________   INITIAL IMPRESSION / ASSESSMENT AND PLAN / ED COURSE  Pertinent labs & imaging results that were available during my care of the patient were reviewed by me and considered in my medical decision making (see chart for details).  This is a 29 year old male who comes in today with a strange sensation in his tonsils. The patient appears to have a tonsillar debris versus tonsilloliths in the left tonsil area. I did use some benzocaine spray to numb the back of the patient's throat and did remove the debris. I'll also give the patient a dose of Decadron. The patient does not have any significant swelling of his tonsils no significant erythema. I will have him follow-up with ENT for further evaluation of his tonsils. ____________________________________________   FINAL CLINICAL IMPRESSION(S) / ED DIAGNOSES  Final diagnoses:  Tonsillar debris  Throat discomfort      Rebecka Apley, MD 08/07/15 [phone]

## 2015-08-07 NOTE — ED Notes (Signed)
Pt dc home ambulatory denies pain instructed on follow up plan PT NAD AT DC 

## 2015-08-11 LAB — CULTURE, GROUP A STREP (THRC)

## 2016-02-12 ENCOUNTER — Emergency Department: Payer: Managed Care, Other (non HMO)

## 2016-02-12 ENCOUNTER — Encounter: Payer: Self-pay | Admitting: *Deleted

## 2016-02-12 ENCOUNTER — Emergency Department
Admission: EM | Admit: 2016-02-12 | Discharge: 2016-02-12 | Disposition: A | Discharge status: Home / Self Care | Payer: Managed Care, Other (non HMO) | Attending: Emergency Medicine | Admitting: Emergency Medicine

## 2016-02-12 DIAGNOSIS — R51 Headache: Secondary | ICD-10-CM | POA: Diagnosis not present

## 2016-02-12 DIAGNOSIS — R0602 Shortness of breath: Secondary | ICD-10-CM | POA: Diagnosis present

## 2016-02-12 DIAGNOSIS — R42 Dizziness and giddiness: Secondary | ICD-10-CM | POA: Insufficient documentation

## 2016-02-12 LAB — BASIC METABOLIC PANEL
Anion gap: 7 (ref 5–15)
BUN: 21 mg/dL — AB (ref 6–20)
CALCIUM: 8.8 mg/dL — AB (ref 8.9–10.3)
CHLORIDE: 108 mmol/L (ref 101–111)
CO2: 23 mmol/L (ref 22–32)
Creatinine, Ser: 1.01 mg/dL (ref 0.61–1.24)
GFR calc Af Amer: >60 mL/min (ref >60)
GFR calc non Af Amer: >60 mL/min (ref >60)
GLUCOSE: 97 mg/dL (ref 65–99)
POTASSIUM: 3.9 mmol/L (ref 3.5–5.1)
Sodium: 138 mmol/L (ref 135–145)

## 2016-02-12 LAB — BLOOD GAS, ARTERIAL
ACID-BASE DEFICIT: 1.1 mmol/L (ref 0.0–2.0)
ALLENS TEST (PASS/FAIL): POSITIVE — AB
Bicarbonate: 24.3 mEq/L (ref 21.0–28.0)
FIO2: 0.21
O2 SAT: 98.2 %
PATIENT TEMPERATURE: 37
PCO2 ART: 42 mmHg (ref 32.0–48.0)
PO2 ART: 110 mmHg — AB (ref 83.0–108.0)
pH, Arterial: 7.37 (ref 7.350–7.450)

## 2016-02-12 LAB — CARBOXYHEMOGLOBIN
Carboxyhemoglobin: 2.7 % (ref 1.5–9.0)
METHEMOGLOBIN: 1.1 %
O2 Saturation: 98.2 %
Total oxygen content: 94.2 mL/dL

## 2016-02-12 LAB — CBC
HCT: 43 % (ref 40.0–52.0)
HEMOGLOBIN: 14.8 g/dL (ref 13.0–18.0)
MCH: 30.7 pg (ref 26.0–34.0)
MCHC: 34.4 g/dL (ref 32.0–36.0)
MCV: 89.2 fL (ref 80.0–100.0)
PLATELETS: 234 10*3/uL (ref 150–440)
RBC: 4.82 MIL/uL (ref 4.40–5.90)
RDW: 12.6 % (ref 11.5–14.5)
WBC: 4.8 10*3/uL (ref 3.8–10.6)

## 2016-02-12 LAB — TROPONIN I

## 2016-02-12 MED ORDER — ONDANSETRON 4 MG PO TBDP
4.0000 mg | ORAL_TABLET | Freq: Once | ORAL | Status: AC
  Administered 2016-02-12: 4 mg via ORAL
  Filled 2016-02-12: qty 1

## 2016-02-12 MED ORDER — MECLIZINE HCL 25 MG PO TABS: 25.0000 mg | ORAL_TABLET | Freq: Three times a day (TID) | ORAL | Status: DC | PRN

## 2016-02-12 MED ORDER — ONDANSETRON 4 MG PO TBDP: 4.0000 mg | ORAL_TABLET | Freq: Three times a day (TID) | ORAL | Status: DC | PRN

## 2016-02-12 MED ORDER — MECLIZINE HCL 25 MG PO TABS
25.0000 mg | ORAL_TABLET | Freq: Once | ORAL | Status: AC
  Administered 2016-02-12: 25 mg via ORAL
  Filled 2016-02-12: qty 1

## 2016-02-12 NOTE — ED Provider Notes (Signed)
[facility] Emergency Department Provider Note   ____________________________________________  Time seen: Approximately 5:35 AM  I have reviewed the triage vital signs and the nursing notes.   HISTORY  Chief Complaint Shortness of Breath; Dizziness; and Headache    HPI Erik Raymond is a 30 y.o. male who presents to the ED from work with a chief complaint of dizziness, headache and shortness of breath. Patient works in the [employer]. Describes onset of dizziness (sensation of room spinning) associated with headache and shortness of breath. Also notes generalized fatigue. Dizziness is worsened by movement of head. 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 Denies associated symptoms of fever, chills, chest pain, abdominal pain, nausea, vomiting, diarrhea. Denies recent travel or trauma.   Past medical history None  There are no active problems to display for this patient.   History reviewed. No pertinent past surgical history.  Current Outpatient Rx  Name  Route  Sig  Dispense  Refill  . traMADol (ULTRAM) 50 MG tablet   Oral   Take 1 tablet (50 mg total) by mouth every 8 (eight) hours as needed (Do not drive or operate machinery while taking as can cause drowsiness.).   12 tablet   0     Allergies Review of patient's allergies indicates no known allergies.  History reviewed. No pertinent family history.  Social History Social History  Substance Use Topics  . Smoking status: Never Smoker   . Smokeless tobacco: None  . Alcohol Use: No    Review of Systems  Constitutional: No fever/chills. Eyes: No visual changes. ENT: No sore throat. Cardiovascular: Denies chest pain. Respiratory: Denies shortness of breath. Gastrointestinal: No abdominal pain.  No nausea, no vomiting.  No diarrhea.  No constipation. Genitourinary: Negative for  dysuria. Musculoskeletal: Negative for back pain. Skin: Negative for rash. Neurological: Positive for headache and dizziness. Negative for focal weakness or numbness.  10-point ROS otherwise negative.  ____________________________________________   PHYSICAL EXAM:  VITAL SIGNS: ED Triage Vitals  Enc Vitals Group     BP 02/12/16 0326 143/78 mmHg     Pulse Rate 02/12/16 0326 60     Resp 02/12/16 0326 18     Temp 02/12/16 0326 97.8 F (36.6 C)     Temp Source 02/12/16 0326 Oral     SpO2 02/12/16 0326 97 %     Weight 02/12/16 0326 185 lb (83.915 kg)     Height 02/12/16 0326  (1.778 m)     Head Cir --      Peak Flow --      Pain Score 02/12/16 0327 10     Pain Loc --      Pain Edu? --      Excl. in GC? --     Constitutional: Asleep, easily awakened for exam. Alert and oriented. Well appearing and in no acute distress. Eyes: Conjunctivae are normal. PERRL. EOMI. Head: Atraumatic. Ears: Bilateral TMs with slight fluid. Nose: No congestion/rhinnorhea. Mouth/Throat: Mucous membranes are moist.  Oropharynx non-erythematous. Neck: No stridor.   Cardiovascular: Normal rate, regular rhythm. Grossly normal heart sounds.  Good peripheral circulation. Respiratory: Normal respiratory effort.  No retractions. Lungs CTAB. Gastrointestinal: Soft and nontender. No distention. No abdominal bruits. No CVA tenderness. Musculoskeletal: No lower extremity tenderness nor edema.  No joint effusions. Neurologic:  Normal speech and language. No gross focal neurologic deficits are appreciated. No gait instability.  Skin:  Skin is warm, dry and intact. No rash noted. Psychiatric: Mood and affect are normal. Speech and behavior are normal.  ____________________________________________   LABS (all labs ordered are listed, but only abnormal results are displayed)  Labs Reviewed  BASIC METABOLIC PANEL - Abnormal; Notable for the following:    BUN 21 (*)    Calcium 8.8 (*)    All other  components within normal limits  BLOOD GAS, ARTERIAL - Abnormal; Notable for the following:    pO2, Arterial 110 (*)    Allens test (pass/fail) POSITIVE (*)    All other components within normal limits  CBC  TROPONIN I  CARBOXYHEMOGLOBIN  URINE DRUG SCREEN, QUALITATIVE ([facility] ONLY)   ____________________________________________  EKG  ED ECG REPORT I, SUNG,JADE J, the attending physician, personally viewed and interpreted this ECG.   Date: 02/12/2016  EKG Time: 0333  Rate: 62  Rhythm: normal EKG, normal sinus rhythm  Axis: Normal  Intervals: BER  ST&T Change: Nonspecific  ____________________________________________  RADIOLOGY  Chest 2 view (view by me, interpreted per Dr. Andria MeuseStevens): No active cardiopulmonary disease. ____________________________________________   PROCEDURES  Procedure(s) performed: None  Critical Care performed: No  ____________________________________________   INITIAL IMPRESSION / ASSESSMENT AND PLAN / ED COURSE  Pertinent labs & imaging results that were available during my care of the patient were reviewed by me and considered in my medical decision making (see chart for details).  30 year old male who presents from work at [employer] with complaints of dizziness which she describes as room spinning, generalized weakness and breathing in fumes. Initial laboratory and imaging results unremarkable. Will add ABG with co-oximetry to evaluate for carbon monoxide. Will administer Antivert and Zofran for relief of symptoms.  ----------------------------------------- 7:16 AM on 02/12/2016 -----------------------------------------  Updated patient of normal carboxyhemoglobin. Will provide prescriptions for Antivert and Zofran, patient will follow-up with his PCP this week. Strict return precautions given. Patient verbalizes understanding and agrees with plan of care. ____________________________________________   FINAL CLINICAL IMPRESSION(S) / ED  DIAGNOSES  Final diagnoses:  Dizziness      NEW MEDICATIONS STARTED DURING THIS VISIT:  New Prescriptions   No medications on file     Note:  This document was prepared using Dragon voice recognition software and may include unintentional dictation errors.    Irean HongJade J Sung, MD 02/12/16 [phone]

## 2016-02-12 NOTE — Discharge Instructions (Signed)
1. You may take medicines as needed for dizziness and nausea (Antivert/Zofran #20). 2. Return to the ER for worsening symptoms, persistent vomiting, difficulty breathing or other concerns.  Dizziness Dizziness is a common problem. It is a feeling of unsteadiness or light-headedness. You may feel like you are about to faint. Dizziness can lead to injury if you stumble or fall. Anyone can become dizzy, but dizziness is more common in older adults. This condition can be caused by a number of things, including medicines, dehydration, or illness. HOME CARE INSTRUCTIONS Taking these steps may help with your condition: Eating and Drinking  Drink enough fluid to keep your urine clear or pale yellow. This helps to keep you from becoming dehydrated. Try to drink more clear fluids, such as water.  Do not drink alcohol.  Limit your caffeine intake if directed by your health care provider.  Limit your salt intake if directed by your health care provider. Activity  Avoid making quick movements.  Rise slowly from chairs and steady yourself until you feel okay.  In the morning, first sit up on the side of the bed. When you feel okay, stand slowly while you hold onto something until you know that your balance is fine.  Move your legs often if you need to stand in one place for a long time. Tighten and relax your muscles in your legs while you are standing.  Do not drive or operate heavy machinery if you feel dizzy.  Avoid bending down if you feel dizzy. Place items in your home so that they are easy for you to reach without leaning over. Lifestyle  Do not use any tobacco products, including cigarettes, chewing tobacco, or electronic cigarettes. If you need help quitting, ask your health care provider.  Try to reduce your stress level, such as with yoga or meditation. Talk with your health care provider if you need help. General Instructions  Watch your dizziness for any changes.  Take medicines  only as directed by your health care provider. Talk with your health care provider if you think that your dizziness is caused by a medicine that you are taking.  Tell a friend or a family member that you are feeling dizzy. If he or she notices any changes in your behavior, have this person call your health care provider.  Keep all follow-up visits as directed by your health care provider. This is important. SEEK MEDICAL CARE IF:  Your dizziness does not go away.  Your dizziness or light-headedness gets worse.  You feel nauseous.  You have reduced hearing.  You have new symptoms.  You are unsteady on your feet or you feel like the room is spinning. SEEK IMMEDIATE MEDICAL CARE IF:  You vomit or have diarrhea and are unable to eat or drink anything.  You have problems talking, walking, swallowing, or using your arms, hands, or legs.  You feel generally weak.  You are not thinking clearly or you have trouble forming sentences. It may take a friend or family member to notice this.  You have chest pain, abdominal pain, shortness of breath, or sweating.  Your vision changes.  You notice any bleeding.  You have a headache.  You have neck pain or a stiff neck.  You have a fever.   This information is not intended to replace advice given to you by your health care provider. Make sure you discuss any questions you have with your health care provider.   Document Released: 03/25/2001 Document Revised: 02/13/2015  Document Reviewed: 09/25/2014 °Elsevier Interactive Patient Education ©2016 [employer] ° °

## 2016-02-12 NOTE — ED Notes (Signed)
Pt to ED from work with HA, dizziness and SOB and generalized fatigue that began while at work this morning around 1230. Pt states "just don't feel good" Pt AAOx4, vitals stable, NAD noted.

## 2016-02-12 NOTE — ED Notes (Signed)
Patient transported to X-ray 

## 2017-12-02 ENCOUNTER — Other Ambulatory Visit: Payer: Self-pay

## 2017-12-02 ENCOUNTER — Encounter: Payer: Self-pay | Admitting: Intensive Care

## 2017-12-02 ENCOUNTER — Emergency Department: Payer: BLUE CROSS/BLUE SHIELD

## 2017-12-02 ENCOUNTER — Emergency Department
Admission: EM | Admit: 2017-12-02 | Discharge: 2017-12-02 | Disposition: A | Discharge status: Home / Self Care | Payer: BLUE CROSS/BLUE SHIELD | Attending: Emergency Medicine | Admitting: Emergency Medicine

## 2017-12-02 DIAGNOSIS — S0093XA Contusion of unspecified part of head, initial encounter: Secondary | ICD-10-CM | POA: Insufficient documentation

## 2017-12-02 DIAGNOSIS — Y999 Unspecified external cause status: Secondary | ICD-10-CM | POA: Insufficient documentation

## 2017-12-02 DIAGNOSIS — Y939 Activity, unspecified: Secondary | ICD-10-CM | POA: Diagnosis not present

## 2017-12-02 DIAGNOSIS — Y929 Unspecified place or not applicable: Secondary | ICD-10-CM | POA: Diagnosis not present

## 2017-12-02 DIAGNOSIS — F17210 Nicotine dependence, cigarettes, uncomplicated: Secondary | ICD-10-CM | POA: Insufficient documentation

## 2017-12-02 DIAGNOSIS — Z79899 Other long term (current) drug therapy: Secondary | ICD-10-CM | POA: Diagnosis not present

## 2017-12-02 DIAGNOSIS — S0990XA Unspecified injury of head, initial encounter: Secondary | ICD-10-CM | POA: Diagnosis present

## 2017-12-02 DIAGNOSIS — W208XXA Other cause of strike by thrown, projected or falling object, initial encounter: Secondary | ICD-10-CM | POA: Insufficient documentation

## 2017-12-02 NOTE — ED Provider Notes (Signed)
[facility] Emergency Department Provider Note  ____________________________________________  Time seen: Approximately 6:32 PM  I have reviewed the triage vital signs and the nursing notes.   HISTORY  Chief Complaint Head Injury    HPI Erik Raymond is a 32 y.o. male presents to the emergency department with 5 out of 10 frontal headache.  Patient reports that he developed headache after a 15 pound weight fell from closet and struck his head.  Patient reports that he initially experienced blurry vision and "nearly blacked out".  Patient reports that he feels "drunk".  He denies instances of skin compromise or prior TBI.  He denies nause or vomiting.  He has been able to ambulate without difficulty and denies neck pain.   History reviewed. No pertinent past medical history.  There are no active problems to display for this patient.   History reviewed. No pertinent surgical history.  Prior to Admission medications   Medication Sig Start Date End Date Taking? Authorizing Provider  meclizine (ANTIVERT) 25 MG tablet Take 1 tablet (25 mg total) by mouth 3 (three) times daily as needed for dizziness or nausea. 02/12/16   Irean Hong, MD  ondansetron (ZOFRAN ODT) 4 MG disintegrating tablet Take 1 tablet (4 mg total) by mouth every 8 (eight) hours as needed for nausea or vomiting. 02/12/16   Irean Hong, MD  traMADol (ULTRAM) 50 MG tablet Take 1 tablet (50 mg total) by mouth every 8 (eight) hours as needed (Do not drive or operate machinery while taking as can cause drowsiness.). 03/11/15   Renford Dills, NP    Allergies Patient has no known allergies.  History reviewed. No pertinent family history.  Social History Social History   Tobacco Use  . Smoking status: Current Every Day Smoker    Types: Cigarettes  . Smokeless tobacco: Never Used  Substance Use Topics  . Alcohol use: Yes    Comment: occ  . Drug use: No     Review of Systems  Constitutional: No  fever/chills Eyes: No visual changes. No discharge ENT: No upper respiratory complaints. Cardiovascular: no chest pain. Respiratory: no cough. No SOB. Gastrointestinal: No abdominal pain.  No nausea, no vomiting.  No diarrhea.  No constipation. Musculoskeletal: Negative for musculoskeletal pain. Skin: Negative for rash, abrasions, lacerations, ecchymosis. Neurological: Patient has headache.   ____________________________________________   PHYSICAL EXAM:  VITAL SIGNS: ED Triage Vitals [12/02/17 1324]  Enc Vitals Group     BP 130/64     Pulse Rate (!) 56     Resp 16     Temp 98.8 F (37.1 C)     Temp Source Oral     SpO2 98 %     Weight 178 lb (80.7 kg)     Height 5\' 10"  (1.778 m)     Head Circumference      Peak Flow      Pain Score 7     Pain Loc      Pain Edu?      Excl. in GC?      Constitutional: Alert and oriented. Well appearing and in no acute distress. Eyes: Conjunctivae are normal. PERRL. EOMI. Head: Atraumatic. ENT:      Ears: TMs are pearly.       Nose: No congestion/rhinnorhea.      Mouth/Throat: Mucous membranes are moist.  Neck: No stridor.  No cervical spine tenderness to palpation. Cardiovascular: Normal rate, regular rhythm. Normal S1 and S2.  Good peripheral circulation. Respiratory: Normal  respiratory effort without tachypnea or retractions. Lungs CTAB. Good air entry to the bases with no decreased or absent breath sounds. Gastrointestinal: Bowel sounds 4 quadrants. Soft and nontender to palpation. No guarding or rigidity. No palpable masses. No distention. No CVA tenderness. Musculoskeletal: Full range of motion to all extremities. No gross deformities appreciated. Neurologic:  Normal speech and language. No gross focal neurologic deficits are appreciated.  Skin:  Skin is warm, dry and intact. No rash noted. Psychiatric: Mood and affect are normal. Speech and behavior are normal. Patient exhibits appropriate insight and  judgement.   ____________________________________________   LABS (all labs ordered are listed, but only abnormal results are displayed)  Labs Reviewed - No data to display ____________________________________________  EKG   ____________________________________________  RADIOLOGY Geraldo PitterI, Cordero Surette M Abrish Erny, personally viewed and evaluated these images (plain radiographs) as part of my medical decision making, as well as reviewing the written report by the radiologist.  Ct Head Wo Contrast  Result Date: 12/02/2017 CLINICAL DATA:  Posttraumatic headache after a weight fell on the patient's right side of head. EXAM: CT HEAD WITHOUT CONTRAST TECHNIQUE: Contiguous axial images were obtained from the base of the skull through the vertex without intravenous contrast. COMPARISON:  03/11/2015 FINDINGS: Brain: No evidence of acute infarction, hemorrhage, hydrocephalus, extra-axial collection or mass lesion/mass effect. Vascular: No hyperdense vessel or unexpected calcification. Skull: No acute fracture.  No significant scalp swelling. Sinuses/Orbits: No acute finding. Other: None. IMPRESSION: Normal head CT. Electronically Signed   By: Tollie Ethavid  Kwon M.D.   On: 12/02/2017 15:38    ____________________________________________    PROCEDURES  Procedure(s) performed:    Procedures    Medications - No data to display   ____________________________________________   INITIAL IMPRESSION / ASSESSMENT AND PLAN / ED COURSE  Pertinent labs & imaging results that were available during my care of the patient were reviewed by me and considered in my medical decision making (see chart for details).  Review of the [place] CSRS was performed in accordance of the [facility] prior to dispensing any controlled drugs.     Assessment and Plan: Head Contusion:  Patient presents to the emergency department with mild headache after a 15 pound weight fell from closet onto patient's head.  Patient reports near loss of  consciousness and in sensation of being "drunk".  Differential diagnosis includes concussion, subdural hematoma and head contusion.  Overall neurologic exam is reassuring.  CT head revealed no acute abnormality.  Patient was advised to use Tylenol as needed for discomfort.  All patient questions were answered.     ____________________________________________  FINAL CLINICAL IMPRESSION(S) / ED DIAGNOSES  Final diagnoses:  Contusion of head, unspecified part of head, initial encounter      NEW MEDICATIONS STARTED DURING THIS VISIT:  ED Discharge Orders    None          This chart was dictated using voice recognition software/Dragon. Despite best efforts to proofread, errors can occur which can change the meaning. Any change was purely unintentional.    Orvil FeilWoods, Thomos Domine M, PA-C 12/02/17 1837    Myrna BlazerSchaevitz, David Matthew, MD 12/02/17 2200

## 2017-12-02 NOTE — ED Triage Notes (Signed)
Patient states he was cleaning out his closet earlier and a weight from in his closet fell and hit R side of his head. No laceration noted and no swelling noted. Patient only c/o slight headache. Patient reports driving to ER with no problems. Ambulatory in triage with no problems

## 2018-02-18 ENCOUNTER — Emergency Department
Admission: EM | Admit: 2018-02-18 | Discharge: 2018-02-19 | Disposition: A | Discharge status: Other Acute Inpatient Hospital | Payer: BLUE CROSS/BLUE SHIELD | Attending: Emergency Medicine | Admitting: Emergency Medicine

## 2018-02-18 DIAGNOSIS — R45851 Suicidal ideations: Secondary | ICD-10-CM | POA: Insufficient documentation

## 2018-02-18 DIAGNOSIS — F332 Major depressive disorder, recurrent severe without psychotic features: Secondary | ICD-10-CM | POA: Diagnosis present

## 2018-02-18 DIAGNOSIS — F429 Obsessive-compulsive disorder, unspecified: Secondary | ICD-10-CM | POA: Diagnosis present

## 2018-02-18 DIAGNOSIS — F919 Conduct disorder, unspecified: Secondary | ICD-10-CM | POA: Insufficient documentation

## 2018-02-18 DIAGNOSIS — Z87891 Personal history of nicotine dependence: Secondary | ICD-10-CM | POA: Insufficient documentation

## 2018-02-18 LAB — COMPREHENSIVE METABOLIC PANEL
ALBUMIN: 4.7 g/dL (ref 3.5–5.0)
ALT: 28 U/L (ref 17–63)
ANION GAP: 10 (ref 5–15)
AST: 33 U/L (ref 15–41)
Alkaline Phosphatase: 59 U/L (ref 38–126)
BUN: 16 mg/dL (ref 6–20)
CHLORIDE: 103 mmol/L (ref 101–111)
CO2: 23 mmol/L (ref 22–32)
Calcium: 9 mg/dL (ref 8.9–10.3)
Creatinine, Ser: 1.11 mg/dL (ref 0.61–1.24)
GFR calc Af Amer: >60 mL/min (ref >60)
Glucose, Bld: 126 mg/dL — ABNORMAL HIGH (ref 65–99)
POTASSIUM: 3.3 mmol/L — AB (ref 3.5–5.1)
Sodium: 136 mmol/L (ref 135–145)
Total Bilirubin: 1.3 mg/dL — ABNORMAL HIGH (ref 0.3–1.2)
Total Protein: 8.4 g/dL — ABNORMAL HIGH (ref 6.5–8.1)

## 2018-02-18 LAB — CBC
HCT: 46.4 % (ref 40.0–52.0)
Hemoglobin: 16 g/dL (ref 13.0–18.0)
MCH: 30.6 pg (ref 26.0–34.0)
MCHC: 34.4 g/dL (ref 32.0–36.0)
MCV: 88.8 fL (ref 80.0–100.0)
Platelets: 241 10*3/uL (ref 150–440)
RBC: 5.23 MIL/uL (ref 4.40–5.90)
RDW: 12.9 % (ref 11.5–14.5)
WBC: 5.3 10*3/uL (ref 3.8–10.6)

## 2018-02-18 LAB — SALICYLATE LEVEL: Salicylate Lvl: <7 mg/dL (ref 2.8–30.0)

## 2018-02-18 LAB — ETHANOL: Alcohol, Ethyl (B): <10 mg/dL (ref <10)

## 2018-02-18 LAB — ACETAMINOPHEN LEVEL

## 2018-02-18 NOTE — ED Notes (Signed)
Patient moved from main ED to [facility] room 1.  Report received from [facility]. Patient states having SI off and on with out a plan. Patient stated hearing voices also off and on giving him life lessons. Patient denies pain/HI and visual hallucinations. Patient was able to verbally contract for safety with this Clinical research associate. Patient Did complain of smelling and states he has been running all day. This writer expressed I would allow him to take a shower once he calms down. Patient tearful during assessment.
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
## 2018-02-18 NOTE — ED Notes (Signed)
Patient in shower at this time. 

## 2018-02-18 NOTE — ED Notes (Addendum)
Patient out of shower and resting in room watching TV. Meal tray and soda given.

## 2018-02-18 NOTE — ED Triage Notes (Signed)
Patient refusing to tell this RN why he is in the ED today. This RN spoke with [employer] officer bringing patient in. Officer reports that patient is under IVC.   Per [employer] officer: Patient expressed SI to his mother yesterday. Patient's mother attempted to bring patient to hospital today for voluntary commitment. Patient jumped out of vehicle and ran away. Patient's mother then went to court to obtain IVC

## 2018-02-18 NOTE — ED Notes (Signed)
Attempt to speak to pt unsuccessful. Asked pt why he was here, pt is uncooperative, confrontational, passive aggressive and surveying the dept for an escape route. Dr Hyman Bible notified. Dr Hyman Bible tried to speak with pt. Pt was passive aggressive and threatening with MD. Doctor stated we could move pt to [facility].

## 2018-02-18 NOTE — ED Notes (Signed)
Was made aware patient's mother is out in waiting room.   Patient gave this Clinical research associate permission to speak with mother about care.

## 2018-02-19 ENCOUNTER — Other Ambulatory Visit: Payer: Self-pay

## 2018-02-19 ENCOUNTER — Inpatient Hospital Stay
Admission: AD | Admit: 2018-02-19 | Discharge: 2018-02-23 | DRG: 885 | Disposition: A | Discharge status: Home / Self Care | Payer: BLUE CROSS/BLUE SHIELD | Source: Intra-hospital | Attending: Psychiatry | Admitting: Psychiatry

## 2018-02-19 ENCOUNTER — Encounter: Payer: Self-pay | Admitting: Psychiatry

## 2018-02-19 DIAGNOSIS — F1220 Cannabis dependence, uncomplicated: Secondary | ICD-10-CM | POA: Diagnosis present

## 2018-02-19 DIAGNOSIS — Z87891 Personal history of nicotine dependence: Secondary | ICD-10-CM

## 2018-02-19 DIAGNOSIS — G4700 Insomnia, unspecified: Secondary | ICD-10-CM | POA: Diagnosis present

## 2018-02-19 DIAGNOSIS — F4323 Adjustment disorder with mixed anxiety and depressed mood: Secondary | ICD-10-CM

## 2018-02-19 DIAGNOSIS — F429 Obsessive-compulsive disorder, unspecified: Secondary | ICD-10-CM | POA: Diagnosis present

## 2018-02-19 DIAGNOSIS — F419 Anxiety disorder, unspecified: Secondary | ICD-10-CM | POA: Diagnosis present

## 2018-02-19 DIAGNOSIS — Z818 Family history of other mental and behavioral disorders: Secondary | ICD-10-CM

## 2018-02-19 DIAGNOSIS — F332 Major depressive disorder, recurrent severe without psychotic features: Secondary | ICD-10-CM

## 2018-02-19 DIAGNOSIS — R45851 Suicidal ideations: Secondary | ICD-10-CM | POA: Diagnosis present

## 2018-02-19 HISTORY — DX: Major depressive disorder, recurrent severe without psychotic features: F33.2

## 2018-02-19 LAB — URINE DRUG SCREEN, QUALITATIVE (ARMC ONLY)
AMPHETAMINES, UR SCREEN: NOT DETECTED
Barbiturates, Ur Screen: NOT DETECTED
Benzodiazepine, Ur Scrn: NOT DETECTED
COCAINE METABOLITE, UR ~~LOC~~: NOT DETECTED
Cannabinoid 50 Ng, Ur ~~LOC~~: POSITIVE — AB
MDMA (Ecstasy)Ur Screen: NOT DETECTED
METHADONE SCREEN, URINE: NOT DETECTED
Opiate, Ur Screen: NOT DETECTED
Phencyclidine (PCP) Ur S: NOT DETECTED
Tricyclic, Ur Screen: NOT DETECTED

## 2018-02-19 MED ORDER — ACETAMINOPHEN 325 MG PO TABS: 650.0000 mg | ORAL_TABLET | Freq: Four times a day (QID) | ORAL | Status: DC | PRN

## 2018-02-19 MED ORDER — DIPHENHYDRAMINE HCL 25 MG PO CAPS
50.0000 mg | ORAL_CAPSULE | Freq: Once | ORAL | Status: AC
  Administered 2018-02-19: 50 mg via ORAL

## 2018-02-19 MED ORDER — TRAZODONE HCL 100 MG PO TABS
100.0000 mg | ORAL_TABLET | Freq: Every day | ORAL | Status: DC
  Filled 2018-02-19 (×2): qty 1

## 2018-02-19 MED ORDER — RISPERIDONE 1 MG PO TABS
1.0000 mg | ORAL_TABLET | Freq: Two times a day (BID) | ORAL | Status: DC
  Filled 2018-02-19 (×2): qty 1

## 2018-02-19 MED ORDER — HYDROXYZINE HCL 50 MG PO TABS: 50.0000 mg | ORAL_TABLET | Freq: Three times a day (TID) | ORAL | Status: DC | PRN

## 2018-02-19 MED ORDER — NICOTINE 21 MG/24HR TD PT24: 21.0000 mg | MEDICATED_PATCH | Freq: Every day | TRANSDERMAL | Status: DC

## 2018-02-19 MED ORDER — DIPHENHYDRAMINE HCL 25 MG PO CAPS
ORAL_CAPSULE | ORAL | Status: AC
  Filled 2018-02-19: qty 1

## 2018-02-19 MED ORDER — ALUM & MAG HYDROXIDE-SIMETH 200-200-20 MG/5ML PO SUSP: 30.0000 mL | ORAL | Status: DC | PRN

## 2018-02-19 MED ORDER — FLUVOXAMINE MALEATE 50 MG PO TABS
50.0000 mg | ORAL_TABLET | Freq: Every day | ORAL | Status: DC
  Filled 2018-02-19 (×2): qty 1

## 2018-02-19 MED ORDER — MAGNESIUM HYDROXIDE 400 MG/5ML PO SUSP: 30.0000 mL | Freq: Every day | ORAL | Status: DC | PRN

## 2018-02-19 NOTE — ED Notes (Signed)
Pt to be transferred to [facility] for involuntary admission. Will transfer via wheelchair with security officer present. Belongings sent to BMU with the patient. Safety maintained.

## 2018-02-19 NOTE — Consult Note (Signed)
The patient will be admitted to [facility]. Full consult to follow.
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
## 2018-02-19 NOTE — BH Assessment (Signed)
Patient is to be admitted to [facility] by Dr. Jennet Maduro.  Attending Physician will be Dr. Jennet Maduro.   Patient has been assigned to room 319, by [facility] Charge Nurse Shatara.   ER staff is aware of the admission:  Davy Pique, ER Sectary   Dr. Pershing Proud, ER MD   Marchelle Folks, Patient's Nurse

## 2018-02-19 NOTE — ED Notes (Signed)
Report given to RN, [facility] for transfer of patient to BMU room 319.

## 2018-02-19 NOTE — ED Provider Notes (Signed)
-----------------------------------------   5:08 AM on 02/19/2018 -----------------------------------------   Blood pressure (!) 152/94, pulse 68, temperature 99.3 F (37.4 C), temperature source Oral, resp. rate 17, height  (1.778 m), weight 78 kg (172 lb), SpO2 99 %.  The patient had no acute events since last update.  Calm and cooperative at this time.  Disposition is pending Psychiatry/Behavioral Medicine team recommendations.     Arnaldo Natal, MD 02/19/18 [phone]
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
## 2018-02-19 NOTE — Progress Notes (Signed)
D: Received patient from [facility]. Patient skin assessment completed with Shatara RN, skin is intact, no contraband found. Patient is low fall risk. Patient reports passive SI stating "I feel like dying." Patient is admitted after reporting SI to mother Caron Presume [id number]). Patient has had multiple deaths in family over the last 5 years. Patient has also lost his job recently.  A: Patient oriented to unit/room/call light. Patient was offered support and encouragement. Patient was encourage to attend groups, participate in unit activities and continue with plan of care. Q x 15 minute observation checks were completed for safety.   R: Patient has no complaints at this time. Patient is receptive to treatment and safety maintained on unit.

## 2018-02-19 NOTE — ED Notes (Addendum)
Note placed in wrong chart.

## 2018-02-19 NOTE — Tx Team (Signed)
Initial Treatment Plan 02/19/2018 4:37 PM Erik Raymond ZOX:[id number]    PATIENT STRESSORS: Financial difficulties Loss of Father 2013, Grandfather 2014, Grandmother 2015. Occupational concerns   PATIENT STRENGTHS: Ability for insight Average or above average intelligence Capable of independent living Communication skills Physical Health   PATIENT IDENTIFIED PROBLEMS: Ineffective Coping Skills 02/19/18  Unstable Mood 02/19/18  Suicidal Ideations 02/19/18                 DISCHARGE CRITERIA:  Ability to meet basic life and health needs Adequate post-discharge living arrangements Improved stabilization in mood, thinking, and/or behavior  PRELIMINARY DISCHARGE PLAN: Attend aftercare/continuing care group Outpatient therapy Return to previous living arrangement Return to previous work or school arrangements  PATIENT/FAMILY INVOLVEMENT: This treatment plan has been presented to and reviewed with the patient, Erik Raymond.  The patient and family have been given the opportunity to ask questions and make suggestions.  Berkley Harvey, RN 02/19/2018, 4:37 PM
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
## 2018-02-19 NOTE — [facility] Suicide Risk Assessment (Addendum)
[facility] Admission Suicide Risk Assessment   Nursing information obtained from:  Patient Demographic factors:  Male Current Mental Status:  Suicidal ideation indicated by patient Loss Factors:  Loss of significant relationship, Financial problems / change in socioeconomic status Historical Factors:  NA Risk Reduction Factors:  NA  Total Time spent with patient: 1 hour Principal Problem: Major depressive disorder, recurrent severe without psychotic features (HCC) Diagnosis:   Patient Active Problem List   Diagnosis Date Noted  . Major depressive disorder, recurrent severe without psychotic features (HCC) [F33.2] 02/19/2018    Priority: High  . OCD (obsessive compulsive disorder) [F42.9] 02/19/2018  . Cannabis use disorder, moderate, dependence (HCC) [F12.20] 02/19/2018   Subjective Data: suicidal ideation  Continued Clinical Symptoms:  Alcohol Use Disorder Identification Test Final Score (AUDIT): 0 The "Alcohol Use Disorders Identification Test", Guidelines for Use in Primary Care, Second Edition.  World Science writer [facility]). Score between 0-7:  no or low risk or alcohol related problems. Score between 8-15:  moderate risk of alcohol related problems. Score between 16-19:  high risk of alcohol related problems. Score 20 or above:  warrants further diagnostic evaluation for alcohol dependence and treatment.   CLINICAL FACTORS:   Depression:   Comorbid alcohol abuse/dependence Impulsivity Alcohol/Substance Abuse/Dependencies Obsessive-Compulsive Disorder   Musculoskeletal: Strength & Muscle Tone: within normal limits Gait & Station: normal Patient leans: N/A  Psychiatric Specialty Exam: Physical Exam  Nursing note and vitals reviewed. Psychiatric: Judgment normal. His affect is blunt. His speech is delayed. He is withdrawn and actively hallucinating. Cognition and memory are normal. He exhibits a depressed mood. He expresses suicidal ideation. He expresses suicidal plans.     Review of Systems  Neurological: Negative.   Psychiatric/Behavioral: Positive for depression, hallucinations, substance abuse and suicidal ideas.  All other systems reviewed and are negative.   Blood pressure 140/89, pulse 100, temperature 98.5 F (36.9 C), temperature source Oral, resp. rate 18, height  (1.778 m), weight 74.4 kg (164 lb), SpO2 100 %.Body mass index is 23.53 kg/m.  General Appearance: Fairly Groomed  Eye Contact:  Good  Speech:  Clear and Coherent  Volume:  Decreased  Mood:  Depressed and Hopeless  Affect:  Depressed, Flat and Tearful  Thought Process:  Goal Directed and Descriptions of Associations: Intact  Orientation:  Full (Time, Place, and Person)  Thought Content:  Hallucinations: Auditory  Suicidal Thoughts:  Yes.  without intent/plan  Homicidal Thoughts:  No  Memory:  Immediate;   Fair Recent;   Fair Remote;   Fair  Judgement:  Fair  Insight:  Present  Psychomotor Activity:  Psychomotor Retardation  Concentration:  Concentration: Fair and Attention Span: Fair  Recall:  [employer] of Knowledge:  Fair  Language:  Fair  Akathisia:  No  Handed:  Right  AIMS (if indicated):     Assets:  Communication Skills Desire for Improvement Housing Physical Health Resilience Social Support  ADL's:  Intact  Cognition:  WNL  Sleep:         COGNITIVE FEATURES THAT CONTRIBUTE TO RISK:  None    SUICIDE RISK:   Moderate:  Frequent suicidal ideation with limited intensity, and duration, some specificity in terms of plans, no associated intent, good self-control, limited dysphoria/symptomatology, some risk factors present, and identifiable protective factors, including available and accessible social support.  PLAN OF CARE: hospital admission, medication management, substance abuse counseling, discharge planning.  Mr, Quiroa is a 32 year old male with a history of depression admitted for worsening of symptoms  and suicidal ideation is the context of  severe social stressors.  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   Kristine Linea, MD 02/19/2018, 4:35 PM

## 2018-02-19 NOTE — ED Notes (Signed)
Patient has been restless in bed. This Clinical research associate spoke to EDP. Verbal order received.

## 2018-02-19 NOTE — ED Notes (Signed)
Pt appears to be asleep in bed at this time with eyes closed, even, unlabored respirations observed. Safety maintained with every 15 minute checks and security officer present. Will continue to monitor.

## 2018-02-19 NOTE — ED Notes (Signed)
Pt awake and alert at this time. Was sitting up in bed when this nurse approached him. Pt requested to wash his face/brush his teeth so that he can eat breakfast. Hygiene products provided. Calm, cooperative and polite at this time. Safety maintained. Will continue to monitor.

## 2018-02-19 NOTE — Progress Notes (Signed)
Patient refused EKG at this time. Patient stated "not now, I just want to be by myself."

## 2018-02-20 DIAGNOSIS — Z87891 Personal history of nicotine dependence: Secondary | ICD-10-CM

## 2018-02-20 DIAGNOSIS — Z818 Family history of other mental and behavioral disorders: Secondary | ICD-10-CM

## 2018-02-20 DIAGNOSIS — F1210 Cannabis abuse, uncomplicated: Secondary | ICD-10-CM

## 2018-02-20 DIAGNOSIS — F429 Obsessive-compulsive disorder, unspecified: Secondary | ICD-10-CM

## 2018-02-20 DIAGNOSIS — F332 Major depressive disorder, recurrent severe without psychotic features: Principal | ICD-10-CM

## 2018-02-20 DIAGNOSIS — F419 Anxiety disorder, unspecified: Secondary | ICD-10-CM

## 2018-02-20 LAB — LIPID PANEL
Cholesterol: 169 mg/dL (ref 0–200)
HDL: 38 mg/dL — AB (ref >40)
LDL CALC: 122 mg/dL — AB (ref 0–99)
Total CHOL/HDL Ratio: 4.4 RATIO
Triglycerides: 45 mg/dL (ref <150)
VLDL: 9 mg/dL (ref 0–40)

## 2018-02-20 LAB — TSH: TSH: 0.701 u[IU]/mL (ref 0.350–4.500)

## 2018-02-20 LAB — HEMOGLOBIN A1C
HEMOGLOBIN A1C: 5.1 % (ref 4.8–5.6)
Mean Plasma Glucose: 99.67 mg/dL

## 2018-02-20 MED ORDER — HALOPERIDOL LACTATE 5 MG/ML IJ SOLN: 10.0000 mg | Freq: Two times a day (BID) | INTRAMUSCULAR | Status: DC | PRN

## 2018-02-20 MED ORDER — DIPHENHYDRAMINE HCL 50 MG/ML IJ SOLN: 50.0000 mg | Freq: Two times a day (BID) | INTRAMUSCULAR | Status: DC | PRN

## 2018-02-20 MED ORDER — HALOPERIDOL 5 MG PO TABS: 10.0000 mg | ORAL_TABLET | Freq: Two times a day (BID) | ORAL | Status: DC | PRN

## 2018-02-20 MED ORDER — DIPHENHYDRAMINE HCL 25 MG PO CAPS
50.0000 mg | ORAL_CAPSULE | Freq: Two times a day (BID) | ORAL | Status: DC | PRN
  Administered 2018-02-21 – 2018-02-22 (×2): 50 mg via ORAL
  Filled 2018-02-20 (×2): qty 2

## 2018-02-20 NOTE — [facility] Suicide Risk Assessment (Signed)
[facility] INPATIENT:  Family/Significant Other Suicide Prevention Education  Suicide Prevention Education:  Patient Refusal for Family/Significant Other Suicide Prevention Education: The patient Erik Raymond has refused to provide written consent for family/significant other to be provided Family/Significant Other Suicide Prevention Education during admission and/or prior to discharge.  Physician notified.  [name]  CUEBAS-COLON 02/20/2018, 11:53 AM

## 2018-02-20 NOTE — Progress Notes (Addendum)
[facility] MD Progress Note  02/20/2018 1:55 PM Erik Raymond  MRN:  [id number] Subjective:  Endorses that he would not like to take any medications.  Says fluvoxamine and risperidone that he took during his last hospitalization made him feel worse and made him hallucinate.  Endorses that he would like the environment to be peaceful and he would like to keep talking to the doctors, says this is what he came to the hospital for.  Endorses that the food is been horrible and complains about a TV not being there in his room.  When I asked him about what caused the decompensation with his job in his house, but says that it is tough to hold on a good house.  Patient's affective response seems appropriate, although he does seem guarded at times.  Says "I do not like pills" he has me permission to talk to his mom and his sister who live in [place] for additional perspective and was going on with him. Denies any auditory visual hallucinations or symptoms suggestive of referential or persecutory delusions.  When asked him how he said that he came to the emergency room, says that the cops brought him.  Says he did not want to argue with the cops because he also wanted to come to the hospital to get checked out.  Says all that he ever wanted was to have somebody to talk to.  Feels like the medications are manufactured and foreign objects and he does not want them in the system.  Spoke with patient's mother Barbette Or -endorses that patient has had serious decompensation in the last few months.  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  Endorses that his brother has been diagnosed to have schizophrenia.  Says patient's condition has gotten worse over the years.  Endorses that he has some degree of irritability, inability to follow through on a task and not accepting that he needs to go to the hospital.  Patient's mom would like to see patient take medications for his  condition. Principal Problem: Major depressive disorder, recurrent severe without psychotic features ([place]) Diagnosis:   Patient Active Problem List   Diagnosis Date Noted  . Major depressive disorder, recurrent severe without psychotic features ([place]) [F33.2] 02/19/2018  . OCD (obsessive compulsive disorder) [F42.9] 02/19/2018  . Cannabis use disorder, moderate, dependence (HCC) [F12.20] 02/19/2018   Total Time spent with patient: 30 minutes  Past Psychiatric History: He was hospitalized at [facility] in 2012 after his father was diagnosed with pancreatic cancer. He did not accept any medications but was in therapy with improvement.    Past Medical History: History reviewed. No pertinent past medical history. History reviewed. No pertinent surgical history. Family History: History reviewed. No pertinent family history. Family Psychiatric  History: Psychosis in first-degree relative Social History:  Social History   Substance and Sexual Activity  Alcohol Use Yes   Comment: occ     Social History   Substance and Sexual Activity  Drug Use No    Social History   Socioeconomic History  . Marital status: Single    Spouse name: Not on file  . Number of children: Not on file  . Years of education: Not on file  . Highest education level: Not on file  Occupational History  . Not on file  Social Needs  . Financial resource strain: Not on file  . Food insecurity:    Worry: Not on file    Inability:  Not on file  . Transportation needs:    Medical: Not on file    Non-medical: Not on file  Tobacco Use  . Smoking status: Former Smoker    Types: Cigarettes  . Smokeless tobacco: Never Used  Substance and Sexual Activity  . Alcohol use: Yes    Comment: occ  . Drug use: No  . Sexual activity: Not on file  Lifestyle  . Physical activity:    Days per week: Not on file    Minutes per session: Not on file  . Stress: Not on file  Relationships  . Social connections:    Talks on phone:  Not on file    Gets together: Not on file    Attends religious service: Not on file    Active member of club or organization: Not on file    Attends meetings of clubs or organizations: Not on file    Relationship status: Not on file  Other Topics Concern  . Not on file  Social History Narrative  . Not on file   Additional Social History:                         Sleep: Fair  Appetite:  Poor  Current Medications: Current Facility-Administered Medications  Medication Dose Route Frequency Provider Last Rate Last Dose  . acetaminophen (TYLENOL) tablet 650 mg  650 mg Oral Q6H PRN Pucilowska, Jolanta B, MD      . alum & mag hydroxide-simeth (MAALOX/MYLANTA) 200-200-20 MG/5ML suspension 30 mL  30 mL Oral Q4H PRN Pucilowska, Jolanta B, MD      . fluvoxaMINE (LUVOX) tablet 50 mg  50 mg Oral QHS Pucilowska, Jolanta B, MD      . hydrOXYzine (ATARAX/VISTARIL) tablet 50 mg  50 mg Oral TID PRN Pucilowska, Jolanta B, MD      . magnesium hydroxide (MILK OF MAGNESIA) suspension 30 mL  30 mL Oral Daily PRN Pucilowska, Jolanta B, MD      . risperiDONE (RISPERDAL) tablet 1 mg  1 mg Oral BID Pucilowska, Jolanta B, MD      . traZODone (DESYREL) tablet 100 mg  100 mg Oral QHS Pucilowska, Jolanta B, MD        Lab Results:  Results for orders placed or performed during the hospital encounter of 02/18/18 (from the past 48 hour(s))  Comprehensive metabolic panel     Status: Abnormal   Collection Time: 02/18/18 10:05 PM  Result Value Ref Range   Sodium 136 135 - 145 mmol/L   Potassium 3.3 (L) 3.5 - 5.1 mmol/L   Chloride 103 101 - 111 mmol/L   CO2 23 22 - 32 mmol/L   Glucose, Bld 126 (H) 65 - 99 mg/dL   BUN 16 6 - 20 mg/dL   Creatinine, Ser 1.11 0.61 - 1.24 mg/dL   Calcium 9.0 8.9 - 10.3 mg/dL   Total Protein 8.4 (H) 6.5 - 8.1 g/dL   Albumin 4.7 3.5 - 5.0 g/dL   AST 33 15 - 41 U/L   ALT 28 17 - 63 U/L   Alkaline Phosphatase 59 38 - 126 U/L   Total Bilirubin 1.3 (H) 0.3 - 1.2 mg/dL   GFR  calc non Af Amer >60 >60 mL/min   GFR calc Af Amer >60 >60 mL/min    Comment: (NOTE) The eGFR has been calculated using the CKD EPI equation. This calculation has not been validated in all clinical situations. eGFR's persistently <60 mL/min signify  possible Chronic Kidney Disease.    Anion gap 10 5 - 15    Comment: Performed at [facility], [address] [place], [place] [address]  Ethanol     Status: None   Collection Time: 02/18/18 10:05 PM  Result Value Ref Range   Alcohol, Ethyl (B) <10 <10 mg/dL    Comment:        LOWEST DETECTABLE LIMIT FOR SERUM ALCOHOL IS 10 mg/dL FOR MEDICAL PURPOSES ONLY Performed at [facility], [address] [place], [place] [address]   Salicylate level     Status: None   Collection Time: 02/18/18 10:05 PM  Result Value Ref Range   Salicylate Lvl <4.8 2.8 - 30.0 mg/dL    Comment: Performed at [facility], [address] [place], [place] [address]  Acetaminophen level     Status: Abnormal   Collection Time: 02/18/18 10:05 PM  Result Value Ref Range   Acetaminophen (Tylenol), Serum <10 (L) 10 - 30 ug/mL    Comment:        THERAPEUTIC CONCENTRATIONS VARY SIGNIFICANTLY. A RANGE OF 10-30 ug/mL MAY BE AN EFFECTIVE CONCENTRATION FOR MANY PATIENTS. HOWEVER, SOME ARE BEST TREATED AT CONCENTRATIONS OUTSIDE THIS RANGE. ACETAMINOPHEN CONCENTRATIONS >150 ug/mL AT 4 HOURS AFTER INGESTION AND >50 ug/mL AT 12 HOURS AFTER INGESTION ARE OFTEN ASSOCIATED WITH TOXIC REACTIONS. Performed at [facility], [address] [place], [place] [address]   cbc     Status: None   Collection Time: 02/18/18 10:05 PM  Result Value Ref Range   WBC 5.3 3.8 - 10.6 K/uL   RBC 5.23 4.40 - 5.90 MIL/uL   Hemoglobin 16.0 13.0 - 18.0 g/dL   HCT 46.4 40.0 - 52.0 %   MCV 88.8 80.0 - 100.0 fL   MCH 30.6 26.0 - 34.0 pg   MCHC 34.4 32.0 - 36.0 g/dL   RDW 12.9 11.5 - 14.5 %   Platelets 241 150 - 440 K/uL    Comment: Performed at  [facility], [address], [place],  [address]  Urine Drug Screen, Qualitative     Status: Abnormal   Collection Time: 02/19/18  3:09 PM  Result Value Ref Range   Tricyclic, Ur Screen NONE DETECTED NONE DETECTED   Amphetamines, Ur Screen NONE DETECTED NONE DETECTED   MDMA (Ecstasy)Ur Screen NONE DETECTED NONE DETECTED   Cocaine Metabolite,Ur [place] NONE DETECTED NONE DETECTED   Opiate, Ur Screen NONE DETECTED NONE DETECTED   Phencyclidine (PCP) Ur S NONE DETECTED NONE DETECTED   Cannabinoid 50 Ng, Ur [place] POSITIVE (A) NONE DETECTED   Barbiturates, Ur Screen NONE DETECTED NONE DETECTED   Benzodiazepine, Ur Scrn NONE DETECTED NONE DETECTED   Methadone Scn, Ur NONE DETECTED NONE DETECTED    Comment: (NOTE) Tricyclics + metabolites, urine    Cutoff 1000 ng/mL Amphetamines + metabolites, urine  Cutoff 1000 ng/mL MDMA (Ecstasy), urine              Cutoff 500 ng/mL Cocaine Metabolite, urine          Cutoff 300 ng/mL Opiate + metabolites, urine        Cutoff 300 ng/mL Phencyclidine (PCP), urine         Cutoff 25 ng/mL Cannabinoid, urine                 Cutoff 50 ng/mL Barbiturates + metabolites, urine  Cutoff 200 ng/mL Benzodiazepine, urine  Cutoff 200 ng/mL Methadone, urine                   Cutoff 300 ng/mL The urine drug screen provides only a preliminary, unconfirmed analytical test result and should not be used for non-medical purposes. Clinical consideration and professional judgment should be applied to any positive drug screen result due to possible interfering substances. A more specific alternate chemical method must be used in order to obtain a confirmed analytical result. Gas chromatography / mass spectrometry (GC/MS) is the preferred confirmat ory method. Performed at [facility], [address] [place], [place] [address]     Blood Alcohol level:  Lab Results  Component Value Date   [facility] Iv <10 02/18/2018   ETH <5 95/62/1308     Metabolic Disorder Labs: No results found for: HGBA1C, MPG No results found for: PROLACTIN No results found for: CHOL, TRIG, HDL, CHOLHDL, VLDL, LDLCALC  Physical Findings: AIMS: Facial and Oral Movements Muscles of Facial Expression: None, normal Lips and Perioral Area: None, normal Jaw: None, normal Tongue: None, normal,Extremity Movements Upper (arms, wrists, hands, fingers): None, normal Lower (legs, knees, ankles, toes): None, normal, Trunk Movements Neck, shoulders, hips: None, normal, Overall Severity Severity of abnormal movements (highest score from questions above): None, normal Incapacitation due to abnormal movements: None, normal Patient's awareness of abnormal movements (rate only patient's report): No Awareness, Dental Status Current problems with teeth and/or dentures?: No Does patient usually wear dentures?: No  CIWA:    COWS:     Musculoskeletal: Strength & Muscle Tone: within normal limits Gait & Station: normal Patient leans: N/A  Psychiatric Specialty Exam: Physical Exam  ROS  Blood pressure (!) 135/110, pulse 74, temperature 98.5 F (36.9 C), temperature source Oral, resp. rate 18, height '5\' 10"'$  (1.778 m), weight 164 lb (74.4 kg), SpO2 100 %.Body mass index is 23.53 kg/m.  General Appearance: Fairly Groomed  Eye Contact:  Good  Speech:  Normal Rate  Volume:  Normal  Mood:  Negative  Affect:  Constricted  Thought Process:  Disorganized  Orientation:  Full (Time, Place, and Person)  Thought Content:  Tangential  Suicidal Thoughts:  No  Homicidal Thoughts:  No  Memory:  NA  Judgement:  Impaired  Insight:  Lacking  Psychomotor Activity:  Normal  AIMS (if indicated):     Assets:  Physical Health Social Support  ADL's:  Intact  Cognition:  WNL  Sleep:  Number of Hours: 7.45     Treatment Plan Summary: Daily contact with patient to assess and evaluate symptoms and progress in treatment and Medication management Mr, Mccubbins is a 32 year old  male with a history of depression admitted for worsening of symptoms and suicidal ideation is the context of severe social stressors.  Patient refuses to take medications, it is possible that patient has a psychotic illness as patient has refused food as well as exhibited some degree of disorganized behavior.  Responses tangential during the interview with patient.  Decompensation corroborated by mom who is concerned about patient and would like patient to take medications for his condition.  #Suicidal ideation -patient able to contract for safety in the hospital  #Mood/anxiety -Continue Luvox 50 mg for depression/OCD -Continue Risperdal 1 mg BID for augmentation -Haldol 10 mg p.o. or IM coupled with Benadryl 50 mg p.o. 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

## 2018-02-20 NOTE — Progress Notes (Signed)
D:Pt denies SI/HI/AVH. Pt upon interaction presents in a posturing and slightly agitated manor. Pt. Forwards little and eye contact is intense/glaring. Pt. During assessment presents with concrete thinking, only able to focus on small parts of our conversation pertaining to what is happening at the exact moment we are talking. Pt. Overall behavior presents bizarre and our conversation is cut short gaining no in depth substance to our conversation, because patient felt he had nothing further to discuss. Pt. When educated about the importance of complying with medication regime put in place by the provider was not receptive and respectfully refused his medications.    A: Q x 15 minute observation checks were completed for safety. Patient was provided with education, but needs reinforcement on importance of medications and treatment plan put in place. Patient  was encourage to attend groups, participate in unit activities and continue with plan of care. Pt. Chart and care plans reviewed. Pt. Given support and encouragement.   R:Patient is appropriate with staff and peers, but does not comply with treatment. Pt. Refused EKG this evening. Will notify day shift of need for EKG per MD orders.              Precautionary checks every 15 minutes for safety maintained, room free of safety hazards, patient sustains no injury or falls during this shift.

## 2018-02-20 NOTE — Plan of Care (Signed)
Pt. Denies SI/HI. Pt. Verbally is able to contract for safety. Pt. Verbalizes he can remain safe while on the unit. Pt. Needs reinforcement on education provided about medications, pt. Continues to refuse medications ordered by Md. Pt. Not compliant with medications.     Problem: Safety: Goal: Ability to disclose and discuss suicidal ideas will improve Outcome: Progressing   Problem: Safety: Goal: Ability to remain free from injury will improve Outcome: Progressing   Problem: Education: Goal: Knowledge of the prescribed therapeutic regimen will improve Outcome: Not Progressing   Problem: Medication: Goal: Compliance with prescribed medication regimen will improve Outcome: Not Progressing

## 2018-02-20 NOTE — Progress Notes (Addendum)
D: Patient denies SI/HI/AVH. Patient denies Anxiety and Depression. Patient verbally contracts for safety. Patient is calm, cooperative. Patient is unwilling to participate with medication. Patient isolates to room, is out for meals. Patient is agitated and anxious at times. Patient comes frequently to nurses station and request nurse to come back to room. Patient is tangential and circumstantial. Patient requests multiple times to see provider, social worker, and mental health staff.   A: Patient was assessed by this nurse. Patient was oriented to unit. Patient's safety was maintained on unit. Q x 15 minute observation checks were completed for safety. Patient care plan was reviewed. Patient was offered support and encouragement. Patient was encourage to attend groups, participate in unit activities and continue with plan of care.   R: Patient has no complaints of pain at this time. Patient is receptive to treatment and safety maintained on unit.

## 2018-02-20 NOTE — [facility] Group Notes (Signed)
LCSW Group Therapy Note  02/20/2018 1:15pm  Type of Therapy and Topic:  Group Therapy:  Cognitive Distortions  Participation Level:  Active   Description of Group:    Patients in this group will be introduced to the topic of cognitive distortions.  Patients will identify and describe cognitive distortions, describe the feelings these distortions create for them.  Patients will identify one or more situations in their personal life where they have cognitively distorted thinking and will verbalize challenging this cognitive distortion through positive thinking skills.  Patients will practice the skill of using positive affirmations to challenge cognitive distortions using affirmation cards.    Therapeutic Goals:  1. Patient will identify two or more cognitive distortions they have used 2. Patient will identify one or more emotions that stem from use of a cognitive distortion 3. Patient will demonstrate use of a positive affirmation to counter a cognitive distortion through discussion and/or role play. 4. Patient will describe one way cognitive distortions can be detrimental to wellness   Summary of Patient Progress: Patient scored his mood at a 10 (10 best.) Patient was able to identify and describe cognitive distortions. He described the feelings these distortions create for him. He shared that the most used unhelpful thinking style he uses is jumping to conclusions. Patient identified situations in his personal life where he has cognitively distorted thinking and was able to challenge these cognitive distortions through positive thinking skills.  Patients practiced the skill of using positive affirmations to challenge cognitive distortions using affirmation cards. Patient actively engaged in group discussion.      Therapeutic Modalities:   Cognitive Behavioral Therapy Motivational Interviewing   Erik Rallo  CUEBAS-COLON, LCSW 02/20/2018 12:14 PM
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
## 2018-02-20 NOTE — BHH Group Notes (Signed)
BHH Group Notes:  (Nursing/MHT/Case Management/Adjunct)  Date:  02/20/2018  Time:  9:54 PM  Type of Therapy:  Group Therapy  Participation Level:  Did Not Attend  Summary of Progress/Problems:  Erik Raymond 02/20/2018, 9:54 PM

## 2018-02-21 DIAGNOSIS — G4700 Insomnia, unspecified: Secondary | ICD-10-CM

## 2018-02-21 DIAGNOSIS — Z564 Discord with boss and workmates: Secondary | ICD-10-CM

## 2018-02-21 DIAGNOSIS — Z79899 Other long term (current) drug therapy: Secondary | ICD-10-CM

## 2018-02-21 NOTE — Plan of Care (Signed)
Pt. Verbalizes understanding of provided education, but needs continued education on the importance of medication compliance and purposes. Pt. Continues to refuse medications. Pt. Denies SI/HI. Pt. Verbally is able to contract for safety.    Problem: Education: Goal: Knowledge of the prescribed therapeutic regimen will improve Outcome: Not Progressing   Problem: Medication: Goal: Compliance with prescribed medication regimen will improve Outcome: Not Progressing   Problem: Education: Goal: Knowledge of [facility] Education information/materials will improve Outcome: Progressing   Problem: Safety: Goal: Ability to disclose and discuss suicidal ideas will improve Outcome: Progressing   Problem: Safety: Goal: Ability to remain free from injury will improve Outcome: Progressing

## 2018-02-21 NOTE — Progress Notes (Signed)
D- Patient alert and oriented. Patient presents in a pleasant mood on assessment stating that he "didn't sleep too good, I tossed and turned" last night. Patient refused medications because "I don't take any medications at home, only something for sleep". Patient denies SI, HI, AVH, and pain at this time. Patient also denies any signs/symptoms of depression and anxiety at this time. Patient's goal for today is to "work out, my legs and chest".  A- Support and encouragement provided.  Routine safety checks conducted every 15 minutes.  Patient informed to notify staff with problems or concerns.  R- Patient contracts for safety at this time. Patient compliant with treatment plan. Patient receptive, calm, and cooperative. Patient interacts well with others on the unit.  Patient remains safe at this time.
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
## 2018-02-21 NOTE — BHH Group Notes (Signed)
LCSW Group Therapy Note 02/21/2018 1:15pm  Type of Therapy and Topic: Group Therapy: Feelings Around Returning Home & Establishing a Supportive Framework and Supporting Oneself When Supports Not Available  Participation Level: Did Not Attend  Description of Group:  Patients first processed thoughts and feelings about upcoming discharge. These included fears of upcoming changes, lack of change, new living environments, judgements and expectations from others and overall stigma of mental health issues. The group then discussed the definition of a supportive framework, what that looks and feels like, and how do to discern it from an unhealthy non-supportive network. The group identified different types of supports as well as what to do when your family/friends are less than helpful or unavailable  Therapeutic Goals  1. Patient will identify one healthy supportive network that they can use at discharge. 2. Patient will identify one factor of a supportive framework and how to tell it from an unhealthy network. 3. Patient able to identify one coping skill to use when they do not have positive supports from others. 4. Patient will demonstrate ability to communicate their needs through discussion and/or role plays.  Summary of Patient Progress:      Therapeutic Modalities Cognitive Behavioral Therapy Motivational Interviewing   Alyissa Whidbee  CUEBAS-COLON, LCSW 02/21/2018 9:58 AM

## 2018-02-21 NOTE — Plan of Care (Signed)
Patient has verbalized understanding of the general information that has been provided to him and all questions/concerns have been addressed and answered at this time. Patient denies SI/HI/AVH as well as any signs/symptoms of depression and anxiety at this time. Patient has verbalized understanding of his prescribed therapeutic regimen, however, he has refused to comply because he states that he does not take any medication, except for something to help him sleep. Patient has the ability to cope and has been observed out in the milieu for more than meal times and has been in his room exercising, which he stated to this writer that his goal was to "work out, my legs and chest". Patient has been functioning at an adequate level and has remained free from injury thus far on the unit. Patient remains safe on the unit at this time.  Problem: Education: Goal: Knowledge of [facility] Education information/materials will improve Outcome: Progressing Goal: Mental status will improve Outcome: Progressing   Problem: Education: Goal: Knowledge of the prescribed therapeutic regimen will improve Outcome: Progressing   Problem: Safety: Goal: Ability to disclose and discuss suicidal ideas will improve Outcome: Progressing   Problem: Self-Concept: Goal: Will verbalize positive feelings about self Outcome: Progressing   Problem: Coping: Goal: Coping ability will improve Outcome: Progressing   Problem: Medication: Goal: Compliance with prescribed medication regimen will improve Outcome: Progressing   Problem: Self-Concept: Goal: Will verbalize positive feelings about self Outcome: Progressing   Problem: Safety: Goal: Ability to remain free from injury will improve Outcome: Progressing   Problem: Spiritual Needs Goal: Ability to function at adequate level Outcome: Progressing

## 2018-02-21 NOTE — Plan of Care (Signed)
Pt calm, cooperative and interacting with peers. Pt denies SI/HI/AVH. Pt refused medications but asked for benedryl to help with sleep. Pt stated his mother talks to him about praying and asking God to help him versus taking medications. Pt inquired about being discharged, referred him to talk with the doctor about discharge. He states he is feeling better mentally. Pt is safety maintained on unit. Will continue to monitor.
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
## 2018-02-21 NOTE — BHH Group Notes (Signed)
BHH Group Notes:  (Nursing/MHT/Case Management/Adjunct)  Date:  02/21/2018  Time:  10:49 PM  Type of Therapy:  Group Therapy  Participation Level:  Active  Participation Quality:  Appropriate  Affect:  Appropriate  Cognitive:  Alert  Insight:  Good  Engagement in Group:  Engaged  Modes of Intervention:  Support  Summary of Progress/Problems:  Erik Raymond 02/21/2018, 10:49 PM 

## 2018-02-21 NOTE — Progress Notes (Signed)
D:Pt denies SI/HI/AVH and all psychiatric symptoms. Pt is hyper-religiously preoccupied this evening during assessment and tangential/flgith of ideas. Pt. Makes statements such as, "see I just needed to manipulate the system some how to get in here, so I could fullfil my purpose in the lords eyes" and "see I just needed to do gods work". Eye contact is intense with some guarded and slightly posturing body movement. Pt. Concentrated also on explaining repeatedly he is not going to be compliant with medications or procedures. Pt. Continues to refuse EKG and medications. Pt. Insists he just needs therapy and to talk to someone. This Clinical research associate spent extensive time trying to let pt. Talk and be therapeutic. Pt. Insight appears poor and is poor historian.    A: Q x 15 minute observation checks were completed for safety. Patient was provided with education, but needs reinforcement. Patient  was encourage to attend groups, participate in unit activities and continue with plan of care. Pt. Chart and plans of care reviewed. Pt. Given support and encouragement.   R:Patient is appropriate in the milieu with staff and peers. Pt. Socializing cheerfully with family, but frequently guarded otherwise.              Precautionary checks every 15 minutes for safety maintained, room free of safety hazards, patient sustains no injury or falls during this shift.

## 2018-02-22 MED ORDER — SODIUM CHLORIDE FLUSH 0.9 % IV SOLN
INTRAVENOUS | Status: AC
  Administered 2018-02-22: 11:00:00
  Filled 2018-02-22: qty 3

## 2018-02-22 NOTE — BH Assessment (Signed)
Writer spoke patient to complete re-assessment. Patient reports symptoms of depression but have limited insight into them. He states he is okay but reports of having thoughts of dying. He continue to mourn the lost of his grandmother, father and grandfather. The three deaths occurred in 2013, 2014 & 2015. They were his primary support and now that he has lost his job, car and his bills are behind, he do not have anyone to talk with.  Patient denies AV/H and HI. He continues to voice SI with no specific plans. He was inpatient approximately two years ago for similar presentation.

## 2018-02-22 NOTE — Progress Notes (Signed)
[facility] MD Progress Note  02/22/2018 12:49 PM Erik Raymond  MRN:  [id number] Subjective:  History was reviewed with patient. He states that he was feeling depressed and wanting to seek counseling. He states, "The way I went about It was really dramatic." He reports that he did report some SI to his mother because he was reaching out for help. He states that his mom was "Forcing me to get help and I wanted to do it on my own." HE admits that he jumped out of a very slow moving car so he could get help for himself. He adamantly denies that this was a suicide attempt but was in an argument with his mother and was impulsive. He states that he has been feeling down because his passion is wrestling and has not been doing it in a while. 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 He is not currently working and sitting at home by himself. He really to get into therapy because this has been helpful for him int he past. He states that he feels that he has met his goals here by being able to talk to people about what he has been going through. He adamantly declines to be on any medications beside benadryl and ibuprofen because "That is how I was raised." HE states that he would rather seek counseling rather than medications. He is actually able to have a very reasonable and rational conversation with me today. He states that SI has resolved and was helpful to talk to people here.When asked what keeps him going, he states, "God and my faith." He states that he plans to start going to church with his mother.  HE met with Lanae Boast with [facility] and he will plan to follow up with them. He wants to meet his goals which are getting back into his passion which is wrestling and also start going tot he gym again which he has not been doing.   Principal Problem: Major depressive disorder, recurrent severe without psychotic features ([place]) Diagnosis:   Patient Active Problem List   Diagnosis Date Noted  . 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 OCD (obsessive compulsive disorder) [F42.9] 02/19/2018  . Cannabis use disorder, moderate, dependence (HCC) [F12.20] 02/19/2018   Total Time spent with patient: 20 minutes  Past Psychiatric History: See H&P  Past Medical History: History reviewed. No pertinent past medical history. History reviewed. No pertinent surgical history. Family History: History reviewed. No pertinent family history. Family Psychiatric  History: See H&P Social History:  Social History   Substance and Sexual Activity  Alcohol Use Yes   Comment: occ     Social History   Substance and Sexual Activity  Drug Use No    Social History   Socioeconomic History  . Marital status: Single    Spouse name: Not on file  . Number of children: Not on file  . Years of education: Not on file  . Highest education level: Not on file  Occupational History  . Not on file  Social Needs  . Financial resource strain: Not on file  . Food insecurity:    Worry: Not on file    Inability: Not on file  . Transportation needs:    Medical: Not on file    Non-medical: Not on file  Tobacco Use  . Smoking status: Former Smoker    Types: Cigarettes  . Smokeless tobacco: Never Used  Substance and  Sexual Activity  . Alcohol use: Yes    Comment: occ  . Drug use: No  . Sexual activity: Not on file  Lifestyle  . Physical activity:    Days per week: Not on file    Minutes per session: Not on file  . Stress: Not on file  Relationships  . Social connections:    Talks on phone: Not on file    Gets together: Not on file    Attends religious service: Not on file    Active member of club or organization: Not on file    Attends meetings of clubs or organizations: Not on file    Relationship status: Not on file  Other Topics Concern  . Not on file  Social History Narrative  . Not on file   Additional Social History:                          Sleep: Fair  Appetite:  Fair  Current Medications: Current Facility-Administered Medications  Medication Dose Route Frequency Provider Last Rate Last Dose  . acetaminophen (TYLENOL) tablet 650 mg  650 mg Oral Q6H PRN Pucilowska, Jolanta B, MD      . alum & mag hydroxide-simeth (MAALOX/MYLANTA) 200-200-20 MG/5ML suspension 30 mL  30 mL Oral Q4H PRN Pucilowska, Jolanta B, MD      . diphenhydrAMINE (BENADRYL) capsule 50 mg  50 mg Oral Q12H PRN Ramond Dial, MD   50 mg at 02/21/18 2144   Or  . diphenhydrAMINE (BENADRYL) injection 50 mg  50 mg Intramuscular Q12H PRN Ramond Dial, MD      . fluvoxaMINE (LUVOX) tablet 50 mg  50 mg Oral QHS Pucilowska, Jolanta B, MD      . haloperidol (HALDOL) tablet 10 mg  10 mg Oral Q12H PRN Ramond Dial, MD       Or  . haloperidol lactate (HALDOL) injection 10 mg  10 mg Intramuscular Q12H PRN Ramond Dial, MD      . hydrOXYzine (ATARAX/VISTARIL) tablet 50 mg  50 mg Oral TID PRN Pucilowska, Jolanta B, MD      . magnesium hydroxide (MILK OF MAGNESIA) suspension 30 mL  30 mL Oral Daily PRN Pucilowska, Jolanta B, MD      . risperiDONE (RISPERDAL) tablet 1 mg  1 mg Oral BID Pucilowska, Jolanta B, MD      . traZODone (DESYREL) tablet 100 mg  100 mg Oral QHS Pucilowska, Jolanta B, MD        Lab Results:  Results for orders placed or performed during the hospital encounter of 02/19/18 (from the past 48 hour(s))  Hemoglobin A1c     Status: None   Collection Time: 02/20/18  1:09 PM  Result Value Ref Range   Hgb A1c MFr Bld 5.1 4.8 - 5.6 %    Comment: (NOTE) Pre diabetes:          5.7%-6.4% Diabetes:              >6.4% Glycemic control for   <7.0% adults with diabetes    Mean Plasma Glucose 99.67 mg/dL    Comment: Performed at [facility], [place] [address] [place], [place] [address]  Lipid panel     Status: Abnormal   Collection Time: 02/20/18  1:09 PM  Result Value Ref Range   Cholesterol 169 0 - 200 mg/dL    Triglycerides 45 <150 mg/dL   HDL 38 (L) >40 mg/dL   Total CHOL/HDL  Ratio 4.4 RATIO   VLDL 9 0 - 40 mg/dL   LDL Cholesterol 122 (H) 0 - 99 mg/dL    Comment:        Total Cholesterol/HDL:CHD Risk Coronary Heart Disease Risk Table                     Men   Women  1/2 Average Risk   3.4   3.3  Average Risk       5.0   4.4  2 X Average Risk   9.6   7.1  3 X Average Risk  23.4   11.0        Use the calculated Patient Ratio above and the CHD Risk Table to determine the patient's CHD Risk.        ATP III CLASSIFICATION (LDL):  <100     mg/dL   Optimal  100-129  mg/dL   Near or Above                    Optimal  130-159  mg/dL   Borderline  160-189  mg/dL   High  >190     mg/dL   Very High Performed at [facility], [address] [place], [place] [address]   TSH     Status: None   Collection Time: 02/20/18  1:09 PM  Result Value Ref Range   TSH 0.701 0.350 - 4.500 uIU/mL    Comment: Performed by a 3rd Generation assay with a functional sensitivity of <=0.01 uIU/mL. Performed at [facility], [address] [place], [place] [address]     Blood Alcohol level:  Lab Results  Component Value Date   [facility] <10 02/18/2018   ETH <5 98/33/8250    Metabolic Disorder Labs: Lab Results  Component Value Date   HGBA1C 5.1 02/20/2018   MPG 99.67 02/20/2018   No results found for: PROLACTIN Lab Results  Component Value Date   CHOL 169 02/20/2018   TRIG 45 02/20/2018   HDL 38 (L) 02/20/2018   CHOLHDL 4.4 02/20/2018   VLDL 9 02/20/2018   LDLCALC 122 (H) 02/20/2018    Physical Findings: AIMS: Facial and Oral Movements Muscles of Facial Expression: None, normal Lips and Perioral Area: None, normal Jaw: None, normal Tongue: None, normal,Extremity Movements Upper (arms, wrists, hands, fingers): None, normal Lower (legs, knees, ankles, toes): None, normal, Trunk Movements Neck, shoulders, hips: None, normal, Overall Severity Severity of abnormal movements  (highest score from questions above): None, normal Incapacitation due to abnormal movements: None, normal Patient's awareness of abnormal movements (rate only patient's report): No Awareness, Dental Status Current problems with teeth and/or dentures?: No Does patient usually wear dentures?: No  CIWA:    COWS:     Musculoskeletal: Strength & Muscle Tone: within normal limits Gait & Station: normal Patient leans: N/A  Psychiatric Specialty Exam: Physical Exam  Nursing note and vitals reviewed.   ROS  Blood pressure (!) 137/106, pulse 60, temperature 97.9 F (36.6 C), temperature source Oral, resp. rate 17, height 5' 10"  (1.778 m), weight 74.4 kg (164 lb), SpO2 98 %.Body mass index is 23.53 kg/m.  General Appearance: Casual, good hygiene  Eye Contact:  Good  Speech:  Clear and Coherent  Volume:  Normal  Mood:  Euthymic  Affect:  Appropriate  Thought Process:  Coherent and Goal Directed  Orientation:  Full (Time, Place, and Person)  Thought Content:  Logical  Suicidal Thoughts:  No  Homicidal Thoughts:  No  Memory:  Immediate;   Fair  Judgement:  Fair  Insight:  Fair  Psychomotor Activity:  Normal  Concentration:  Concentration: Fair  Recall:  [employer] of Knowledge:  Fair  Language:  Fair  Akathisia:  No      Assets:  Resilience  ADL's:  Intact  Cognition:  WNL  Sleep:  Number of Hours: 7     Treatment Plan Summary: 32 yo male admitted due to agitation Nd SI.There were reports that he was disorganized and agitated over the weekend. Today, he is very calm, polite and we were able to have very rational and reasonable conversation. He was very organized in thoughts. He is adament that he does not want to be on medications. He denies SI or thoughts of self harm. He slept well last night.   Plan:  Mood -pt declines any medications. SI has resolved and is organized and goal directed. He does not meet criteria for any forced medications at this time  Insomnia -prn  benadryl  Dispo He will discharge home when stable and follow up with [place], MD 02/22/2018, 12:49 PM

## 2018-02-22 NOTE — Tx Team (Addendum)
Interdisciplinary Treatment and Diagnostic Plan Update  02/22/2018 Time of Session: [address] Ramal Eckhardt MRN: [id number]  Principal Diagnosis: Major depressive disorder, recurrent severe without psychotic features (HCC)  Secondary Diagnoses: Principal Problem:   Major depressive disorder, recurrent severe without psychotic features (HCC) Active Problems:   OCD (obsessive compulsive disorder)   Cannabis use disorder, moderate, dependence (HCC)   Current Medications:  Current Facility-Administered Medications  Medication Dose Route Frequency Provider Last Rate Last Dose  . acetaminophen (TYLENOL) tablet 650 mg  650 mg Oral Q6H PRN Pucilowska, Jolanta B, MD      . alum & mag hydroxide-simeth (MAALOX/MYLANTA) 200-200-20 MG/5ML suspension 30 mL  30 mL Oral Q4H PRN Pucilowska, Jolanta B, MD      . diphenhydrAMINE (BENADRYL) capsule 50 mg  50 mg Oral Q12H PRN Aundria Rud, MD   50 mg at 02/21/18 2144   Or  . diphenhydrAMINE (BENADRYL) injection 50 mg  50 mg Intramuscular Q12H PRN Aundria Rud, MD      . fluvoxaMINE (LUVOX) tablet 50 mg  50 mg Oral QHS Pucilowska, Jolanta B, MD      . haloperidol (HALDOL) tablet 10 mg  10 mg Oral Q12H PRN Aundria Rud, MD       Or  . haloperidol lactate (HALDOL) injection 10 mg  10 mg Intramuscular Q12H PRN Aundria Rud, MD      . hydrOXYzine (ATARAX/VISTARIL) tablet 50 mg  50 mg Oral TID PRN Pucilowska, Jolanta B, MD      . magnesium hydroxide (MILK OF MAGNESIA) suspension 30 mL  30 mL Oral Daily PRN Pucilowska, Jolanta B, MD      . risperiDONE (RISPERDAL) tablet 1 mg  1 mg Oral BID Pucilowska, Jolanta B, MD      . traZODone (DESYREL) tablet 100 mg  100 mg Oral QHS Pucilowska, Jolanta B, MD       PTA Medications: No medications prior to admission.    Patient Stressors: Financial difficulties Loss of Father 2013, Grandfather 2014, Grandmother 2015. Occupational concerns  Patient Strengths: Ability for insight Average  or above average intelligence Capable of independent living Communication skills Physical Health  Treatment Modalities: Medication Management, Group therapy, Case management,  1 to 1 session with clinician, Psychoeducation, Recreational therapy.   Physician Treatment Plan for Primary Diagnosis: Major depressive disorder, recurrent severe without psychotic features (HCC) Long Term Goal(s): Improvement in symptoms so as ready for discharge Improvement in symptoms so as ready for discharge   Short Term Goals: Ability to identify changes in lifestyle to reduce recurrence of condition will improve Ability to verbalize feelings will improve Ability to disclose and discuss suicidal ideas Ability to demonstrate self-control will improve Ability to identify and develop effective coping behaviors will improve Ability to maintain clinical measurements within normal limits will improve Compliance with prescribed medications will improve Ability to identify triggers associated with substance abuse/mental health issues will improve Ability to identify changes in lifestyle to reduce recurrence of condition will improve Ability to demonstrate self-control will improve Ability to identify triggers associated with substance abuse/mental health issues will improve  Medication Management: Evaluate patient's response, side effects, and tolerance of medication regimen.  Therapeutic Interventions: 1 to 1 sessions, Unit Group sessions and Medication administration.  Evaluation of Outcomes: Progressing  Physician Treatment Plan for Secondary Diagnosis: Principal Problem:   Major depressive disorder, recurrent severe without psychotic features (HCC) Active Problems:   OCD (obsessive compulsive disorder)   Cannabis use disorder, moderate, dependence (HCC)  Long Term Goal(s):  Improvement in symptoms so as ready for discharge Improvement in symptoms so as ready for discharge   Short Term Goals: Ability to  identify changes in lifestyle to reduce recurrence of condition will improve Ability to verbalize feelings will improve Ability to disclose and discuss suicidal ideas Ability to demonstrate self-control will improve Ability to identify and develop effective coping behaviors will improve Ability to maintain clinical measurements within normal limits will improve Compliance with prescribed medications will improve Ability to identify triggers associated with substance abuse/mental health issues will improve Ability to identify changes in lifestyle to reduce recurrence of condition will improve Ability to demonstrate self-control will improve Ability to identify triggers associated with substance abuse/mental health issues will improve     Medication Management: Evaluate patient's response, side effects, and tolerance of medication regimen.  Therapeutic Interventions: 1 to 1 sessions, Unit Group sessions and Medication administration.  Evaluation of Outcomes: Progressing   RN Treatment Plan for Primary Diagnosis: Major depressive disorder, recurrent severe without psychotic features (HCC) Long Term Goal(s): Knowledge of disease and therapeutic regimen to maintain health will improve  Short Term Goals: Ability to participate in decision making will improve, Ability to identify and develop effective coping behaviors will improve and Compliance with prescribed medications will improve  Medication Management: RN will administer medications as ordered by provider, will assess and evaluate patient's response and provide education to patient for prescribed medication. RN will report any adverse and/or side effects to prescribing provider.  Therapeutic Interventions: 1 on 1 counseling sessions, Psychoeducation, Medication administration, Evaluate responses to treatment, Monitor vital signs and CBGs as ordered, Perform/monitor CIWA, COWS, AIMS and Fall Risk screenings as ordered, Perform wound care  treatments as ordered.  Evaluation of Outcomes: Progressing   LCSW Treatment Plan for Primary Diagnosis: Major depressive disorder, recurrent severe without psychotic features (HCC) Long Term Goal(s): Safe transition to appropriate next level of care at discharge, Engage patient in therapeutic group addressing interpersonal concerns.  Short Term Goals: Engage patient in aftercare planning with referrals and resources, Increase emotional regulation and Increase skills for wellness and recovery  Therapeutic Interventions: Assess for all discharge needs, 1 to 1 time with Social worker, Explore available resources and support systems, Assess for adequacy in community support network, Educate family and significant other(s) on suicide prevention, Complete Psychosocial Assessment, Interpersonal group therapy.  Evaluation of Outcomes: Progressing   Progress in Treatment: Attending groups: Yes. Participating in groups: Yes. Taking medication as prescribed: Yes. Toleration medication: Yes. Family/Significant other contact made: No, will contact:  a support if pt provides consent  Patient understands diagnosis: Yes. Discussing patient identified problems/goals with staff: Yes. Medical problems stabilized or resolved: Yes. Denies suicidal/homicidal ideation: Yes. Issues/concerns per patient self-inventory: No. Other: None at this time.   New problem(s) identified: No, Describe:  none at this time.   New Short Term/Long Term Goal(s): Pt reported his goal for treatment is, "to get up and do what I need to do to get out of here. And, I want to go to counseling."   Discharge Plan or Barriers: Pt will discharge home and will continue tx with [facility] in the outpatient setting.   Reason for Continuation of Hospitalization: Depression Hallucinations Medication stabilization  Estimated Length of Stay: 1 day  Recreational Therapy: Patient Stressors: My bills Patient Goal: Patient will engage in  groups without prompting or encouragement from LRT x3 group sessions within 5 recreation therapy group sessions  Attendees: Patient: Erik Raymond 02/22/2018 11:11 AM  Physician: Dr. Johnella Moloney, MD  02/22/2018 11:11 AM  Nursing: Hulan Amato, RN  02/22/2018 11:11 AM  RN Care Manager:  02/22/2018 11:11 AM  Social Worker: Heidi Dach, LCSW 02/22/2018 11:11 AM  Recreational Therapist: Garret Reddish, CTRS-LRT 02/22/2018 11:11 AM  Other: Johny Shears, LCSWA 02/22/2018 11:11 AM  Other: Jake Shark, LCSW 02/22/2018 11:11 AM  Other: 02/22/2018 11:11 AM    Scribe for Treatment Team: Heidi Dach, LCSW 02/22/2018 11:11 AM

## 2018-02-22 NOTE — Progress Notes (Signed)
Recreation Therapy Notes  Date: 02/22/2018  Time: 9:30 am   Location: Craft Room   Behavioral response: N/A   Intervention Topic:  Problem Solving  Discussion/Intervention: Patient did not attend group.   Clinical Observations/Feedback:  Patient did not attend group.   Charlotte Fidalgo LRT/CTRS        Nyoka Alcoser 02/22/2018 10:10 AM

## 2018-02-22 NOTE — Progress Notes (Signed)
CSW met with pt to have him sign release of information for his mother, Barbette Or at [phone]. Pt was agreeable and signed ROI.   CSW contacted pt's mother to coordinate pt's discharge. Pt's mother stated, "I don't know why I even brought him there--it seems like a waste of time. He hasn't taken any medications since he's been there." CSW explained that pt has not reported +SI and does not meet criteria to received forced medications. Pt's mother expressed understanding and agreement with this information. CSW explained pt would be referred to [facility] for medication management and peer support services. Pt's mother was in agreement with this discharge plan and stated she would pick pt up when he was ready for discharge.   CSW will continue to coordinate with pt's mother as needed for further updates and discharge planning.   Alden Hipp, MSW, LCSW Clinical Social Worker 02/22/2018 2:25 PM

## 2018-02-22 NOTE — Plan of Care (Signed)
Patient is alert and oriented X 4. Patient denies SI, HI and AVH. This morning patient complained of having irritation of the left eye due to contact lenses. Patient received normal saline to help refresh contact lens. Patient also complained about having dirty lenin. RN explained supplies are given to patients upon request. RN provided patient with clean lenin. Patient is logical and coherent in thinking, able to express feelings and concerns. Patient refuses to take medications, stating " I refuse all hardcore medications, I don't need them."  Patient attended groups over the weekend and has been pleasant with staff. Nurse will continue to monitor. Problem: Coping: Goal: Coping ability will improve Outcome: Progressing   Problem: Safety: Goal: Ability to disclose and discuss suicidal ideas will improve Outcome: Progressing   Problem: Medication: Goal: Compliance with prescribed medication regimen will improve Outcome: Progressing   Problem: Self-Concept: Goal: Will verbalize positive feelings about self Outcome: Progressing   Problem: Safety: Goal: Ability to remain free from injury will improve Outcome: Progressing

## 2018-02-22 NOTE — Progress Notes (Signed)
Recreation Therapy Notes  INPATIENT RECREATION THERAPY ASSESSMENT  Patient Details Name: Erik Raymond MRN: [id number] DOB: 27-Nov-1985 Today's Date: 02/22/2018       Information Obtained From: Patient  Able to Participate in Assessment/Interview: Yes  Patient Presentation: Responsive  Reason for Admission (Per Patient): Suicidal Ideation  Patient Stressors: (My bills)  Coping Skills:   Exercise, Prayer  Leisure Interests (2+):  Community - Theater/Cinema, Garment/textile technologist - Technical brewer of Recreation/Participation: Chief Executive Officer of Community Resources:  Yes  Community Resources:  Park, Engineering geologist  Current Use: Yes  If no, Barriers?:    Expressed Interest in [employer] Information:    [place] of Residence:  Film/video editor  Patient Main Form of Transportation: Walk  Patient Strengths:  Ability to communication with others  Patient Identified Areas of Improvement:  Manage my stress better  Patient Goal for Hospitalization:  To do what I need to so I can leave it here.  Current SI (including self-harm):  No  Current HI:  No  Current AVH: No  Staff Intervention Plan: Group Attendance, Collaborate with Interdisciplinary Treatment Team  Consent to Intern Participation: N/A  Tayja Manzer 02/22/2018, 3:01 PM

## 2018-02-22 NOTE — [facility] Group Notes (Signed)
LCSW Group Therapy Note   02/22/2018 1:00pm   Type of Therapy and Topic:  Group Therapy:  Overcoming Obstacles   Participation Level:  Minimal   Description of Group:    In this group patients will be encouraged to explore what they see as obstacles to their own wellness and recovery. They will be guided to discuss their thoughts, feelings, and behaviors related to these obstacles. The group will process together ways to cope with barriers, with attention given to specific choices patients can make. Each patient will be challenged to identify changes they are motivated to make in order to overcome their obstacles. This group will be process-oriented, with patients participating in exploration of their own experiences as well as giving and receiving support and challenge from other group members.   Therapeutic Goals: 1. Patient will identify personal and current obstacles as they relate to admission. 2. Patient will identify barriers that currently interfere with their wellness or overcoming obstacles.  3. Patient will identify feelings, thought process and behaviors related to these barriers. 4. Patient will identify two changes they are willing to make to overcome these obstacles:      Summary of Patient Progress Able to meet therapeutic goals however remains somewhat reserved.       Therapeutic Modalities:   Cognitive Behavioral Therapy Solution Focused Therapy Motivational Interviewing Relapse Prevention Therapy  Glennon Mac, LCSW 02/22/2018 4:09 PM

## 2018-02-23 ENCOUNTER — Encounter: Payer: Self-pay | Admitting: Psychiatry

## 2018-02-23 DIAGNOSIS — F4323 Adjustment disorder with mixed anxiety and depressed mood: Secondary | ICD-10-CM

## 2018-02-23 NOTE — Progress Notes (Signed)
Patient is alert and oriented x 4, denies pain and discomfort no distress noted, denies SI/HI/AVH, patient's thoughts are organized and logical no bizarre behavior noted. Patient  Is interacting with peers and staff appropriately, attended evening group and complaint with medication, will continue to closely monitor.

## 2018-02-23 NOTE — Progress Notes (Signed)
Recreation Therapy Notes  INPATIENT RECREATION TR PLAN  Patient Details Name: Rmani Kellogg MRN: [id number] DOB: [birth year] Today's Date: 02/23/2018  Rec Therapy Plan Is patient appropriate for Therapeutic Recreation?: Yes Treatment times per week: at least 3 Estimated Length of Stay: 5-7 days TR Treatment/Interventions: Group participation (Comment)  Discharge Criteria Pt will be discharged from therapy if:: Discharged Treatment plan/goals/alternatives discussed and agreed upon by:: Patient/family  Discharge Summary Short term goals set: Patient will engage in groups without prompting or encouragement from LRT x3 group sessions within 5 recreation therapy group sessions Short term goals met: Not met Reason goals not met: Patient did not attend any groups Therapeutic equipment acquired: N/A Reason patient discharged from therapy: Discharge from hospital Pt/family agrees with progress & goals achieved: Yes Date patient discharged from therapy: 02/23/18   Prakash Kimberling 02/23/2018, 3:32 PM

## 2018-02-23 NOTE — BHH Group Notes (Signed)
CSW Group Therapy Note  02/23/2018  Time:  0900  Type of Therapy and Topic: Group Therapy: Goals Group: SMART Goals    Participation Level:  Did Not Attend    Description of Group:   The purpose of a daily goals group is to assist and guide patients in setting recovery/wellness-related goals. The objective is to set goals as they relate to the crisis in which they were admitted. Patients will be using SMART goal modalities to set measurable goals. Characteristics of realistic goals will be discussed and patients will be assisted in setting and processing how one will reach their goal. Facilitator will also assist patients in applying interventions and coping skills learned in psycho-education groups to the SMART goal and process how one will achieve defined goal.    Therapeutic Goals:  -Patients will develop and document one goal related to or their crisis in which brought them into treatment.  -Patients will be guided by LCSW using SMART goal setting modality in how to set a measurable, attainable, realistic and time sensitive goal.  -Patients will process barriers in reaching goal.  -Patients will process interventions in how to overcome and successful in reaching goal.    Patient's Goal:  Pt was invited to attend group but chose not to attend. CSW will continue to encourage pt to attend group throughout their admission.   Therapeutic Modalities:  Motivational Interviewing  Cognitive Behavioral Therapy  Crisis Intervention Model  SMART goals setting  Heidi Dach, MSW, LCSW Clinical Social Worker 02/23/2018 9:44 AM

## 2018-02-23 NOTE — Progress Notes (Signed)
Recreation Therapy Notes   Date: 02/23/2018  Time: 9:30 am   Location: Craft Room   Behavioral response: N/A   Intervention Topic:  Coping  Discussion/Intervention: Patient did not attend group.   Clinical Observations/Feedback:  Patient did not attend group.   Allard Lightsey LRT/CTRS        Erik Raymond 02/23/2018 10:50 AM 

## 2018-02-23 NOTE — Progress Notes (Signed)
Patient alert and oriented x 4. Denies SI/HI/AVH and pain at this time. Present in the milieu with appropriate peer interaction. Attends group with active participation noted. Adequate po intake noted. Patient is scheduled to be discharged today. Patient will follow up with [facility] on Wednesday 02/24/18 at 0700. Mother picked up patient to go home. Patient verbalized understanding the discharge instructions, departed the unit with discharge paperwork and personal items.

## 2018-02-23 NOTE — Plan of Care (Signed)
Patient denies SI/HI/AVH interacting appropriately no bizarre behavior noted.

## 2018-02-23 NOTE — BHH Suicide Risk Assessment (Signed)
[facility] Discharge Suicide Risk Assessment   Principal Problem: Major depressive disorder, recurrent severe without psychotic features [facility]) Discharge Diagnoses:  Patient Active Problem List   Diagnosis Date Noted  . Major depressive disorder, recurrent severe without psychotic features (HCC) [F33.2] 02/19/2018  . OCD (obsessive compulsive disorder) [F42.9] 02/19/2018  . Cannabis use disorder, moderate, dependence (HCC) [F12.20] 02/19/2018    Mental Status Per Nursing Assessment::   On Admission:  Suicidal ideation indicated by patient  Demographic Factors:  Male, Living alone and Unemployed  Loss Factors: Decrease in vocational status  Historical Factors: Impulsivity  Risk Reduction Factors:   Sense of responsibility to family, Positive social support, Positive therapeutic relationship and Positive coping skills or problem solving skills  Continued Clinical Symptoms:  None  Cognitive Features That Contribute To Risk:  None    Suicide Risk:  Minimal Acute: No identifiable suicidal ideation.   Follow-up Information    [facility] on 02/24/2018.   Why:  Unk Pinto (Peer Support Specialist with [facility]) will pick you up on Wednesday, 02/24/18 at 7AM to begin Peer Support Services with [facility]. Thank you.  Contact information: [address] [place] [address] [phone]           Plan Of Care/Follow-up recommendations: Follow up with [facility]  Haskell Riling, MD 02/23/2018, 9:43 AM

## 2018-02-23 NOTE — Discharge Summary (Signed)
Physician Discharge Summary Note  Patient:  Erik Raymond is an 32 y.o., male MRN:  [id number] DOB:  Aug 15, 1986 Patient phone:  [phone] (home)  Patient address:   [address] [place] [address],  Total Time spent with patient: 15 minutes  Plus 20 minutes of medication reconciliation, discharge planning, and discharge documentation   Date of Admission:  02/19/2018 Date of Discharge: 02/23/18  Reason for Admission:  Suicidal thoughts, agitation  Principal Problem: Major depressive disorder, recurrent severe without psychotic features [facility]) Discharge Diagnoses: Patient Active Problem List   Diagnosis Date Noted  . Major depressive disorder, recurrent severe without psychotic features ([place]) [F33.2] 02/19/2018  . OCD (obsessive compulsive disorder) [F42.9] 02/19/2018  . Cannabis use disorder, moderate, dependence ([place]) [F12.20] 02/19/2018    Past Psychiatric History: See H&P  Past Medical History: History reviewed. No pertinent past medical history. History reviewed. No pertinent surgical history. Family History: History reviewed. No pertinent family history. Family Psychiatric  History: See H&P Social History:  Social History   Substance and Sexual Activity  Alcohol Use Yes   Comment: occ     Social History   Substance and Sexual Activity  Drug Use No    Social History   Socioeconomic History  . Marital status: Single    Spouse name: Not on file  . Number of children: Not on file  . Years of education: Not on file  . Highest education level: Not on file  Occupational History  . Not on file  Social Needs  . Financial resource strain: Not on file  . Food insecurity:    Worry: Not on file    Inability: Not on file  . Transportation needs:    Medical: Not on file    Non-medical: Not on file  Tobacco Use  . Smoking status: Former Smoker    Types: Cigarettes  . Smokeless tobacco: Never Used  Substance and Sexual Activity  . Alcohol use: Yes     Comment: occ  . Drug use: No  . Sexual activity: Not on file  Lifestyle  . Physical activity:    Days per week: Not on file    Minutes per session: Not on file  . Stress: Not on file  Relationships  . Social connections:    Talks on phone: Not on file    Gets together: Not on file    Attends religious service: Not on file    Active member of club or organization: Not on file    Attends meetings of clubs or organizations: Not on file    Relationship status: Not on file  Other Topics Concern  . Not on file  Social History Narrative  . Not on file    Hospital Course:  Pt was started on medications however he consistently declined to take any medications. There was some concern for disorganized thinking by weekend physician. When he was evaluated by this provide, he had no evidence of disorganized behaviors. He was very calm and organized in thoughts. We were able to have very reasonable and rational conversation. HE did not display any unsafe behaviors on the unit. He was sleeping well. Benefits of medications were discussed and he continued to decline medications stating that he wanted to try therapy first. On day of discharge, pt had bright affect. He reported that he was feeling better because he was able to go to groups and talk to staff about what he has been going through. He was calm and organized in  thoughts. No bizzare or delusional thought content at all. He met with Lanae Boast (peer support) with [facility] and plans to meet with him tomorrow at [facility] to get started in therapy. He states, "I'm actually excited to start there." He continued to decline medications. When asked, he denied SI or thoughts of self harm. He denied HI, AH, Vh. I spoke with his mother and answered any questions she had. He was future oriented and felt safe discharging. He did not meet IVC criteria.   The patient is at low risk of imminent suicide. Patient denied thoughts, intent, or plan for harm to self or others,  expressed significant future orientation, and expressed an ability to mobilize assistance for his needs. he is presently void of any contributing psychiatric symptoms, cognitive difficulties, or substance use which would elevate his risk for lethality. Chronic risk for lethality is elevated in light of male gender, impulsivity. The chronic risk is presently mitigated by his ongoing desire and engagement in [facility] treatment and mobilization of support from family and friends. Chronic risk may elevate if he experiences any significant loss or worsening of symptoms, which can be managed and monitored through outpatient providers. At this time,a cute risk for lethality is low and he is stable for ongoing outpatient management.   Modifiable risk factors were addressed during this hospitalization through appropriate pharmacotherapy and establishment of outpatient follow-up treatment. Some risk factors for suicide are situational (i.e. Unstable housing) or related personality pathology (i.e. Poor coping mechanisms) and thus cannot be further mitigated by continued hospitalization in this setting.    Physical Findings: AIMS: Facial and Oral Movements Muscles of Facial Expression: None, normal Lips and Perioral Area: None, normal Jaw: None, normal Tongue: None, normal,Extremity Movements Upper (arms, wrists, hands, fingers): None, normal Lower (legs, knees, ankles, toes): None, normal, Trunk Movements Neck, shoulders, hips: None, normal, Overall Severity Severity of abnormal movements (highest score from questions above): None, normal Incapacitation due to abnormal movements: None, normal Patient's awareness of abnormal movements (rate only patient's report): No Awareness, Dental Status Current problems with teeth and/or dentures?: No Does patient usually wear dentures?: No  CIWA:    COWS:     Musculoskeletal: Strength & Muscle Tone: within normal limits Gait & Station: normal Patient leans:  N/A  Psychiatric Specialty Exam: Physical Exam  Nursing note and vitals reviewed.   Review of Systems  All other systems reviewed and are negative.   Blood pressure 131/78, pulse 66, temperature 98 F (36.7 C), temperature source Oral, resp. rate 16, height 5' 10"  (1.778 m), weight 74.4 kg (164 lb), SpO2 100 %.Body mass index is 23.53 kg/m.  General Appearance: Casual  Eye Contact:  Good  Speech:  Clear and Coherent  Volume:  Normal  Mood:  Euthymic  Affect:  Appropriate  Thought Process:  Coherent and Goal Directed  Orientation:  Full (Time, Place, and Person)  Thought Content:  Logical  Suicidal Thoughts:  No  Homicidal Thoughts:  No  Memory:  Immediate;   Fair  Judgement:  Fair  Insight:  Fair  Psychomotor Activity:  Normal  Concentration:  Concentration: Fair  Recall:  [employer] of Knowledge:  Fair  Language:  Fair  Akathisia:  No      Assets:  Resilience  ADL's:  Intact  Cognition:  WNL  Sleep:  Number of Hours: 5     Have you used any form of tobacco in the last 30 days? (Cigarettes, Smokeless Tobacco, Cigars, and/or Pipes): No  Has this  patient used any form of tobacco in the last 30 days? (Cigarettes, Smokeless Tobacco, Cigars, and/or Pipes) No  Blood Alcohol level:  Lab Results  Component Value Date   ETH <10 02/18/2018   ETH <5 88/32/5498    Metabolic Disorder Labs:  Lab Results  Component Value Date   HGBA1C 5.1 02/20/2018   MPG 99.67 02/20/2018   No results found for: PROLACTIN Lab Results  Component Value Date   CHOL 169 02/20/2018   TRIG 45 02/20/2018   HDL 38 (L) 02/20/2018   CHOLHDL 4.4 02/20/2018   VLDL 9 02/20/2018   LDLCALC 122 (H) 02/20/2018    See Psychiatric Specialty Exam and Suicide Risk Assessment completed by Attending Physician prior to discharge.  Discharge destination:  Home  Is patient on multiple antipsychotic therapies at discharge:  No   Has Patient had three or more failed trials of antipsychotic monotherapy  by history:  No  Recommended Plan for Multiple Antipsychotic Therapies: NA  Discharge Instructions    Increase activity slowly   Complete by:  As directed      Allergies as of 02/23/2018   No Known Allergies     Medication List    You have not been prescribed any medications.    Follow-up Information    [facility] on 02/24/2018.   Why:  Sherrian Divers (Peer Support Specialist with [facility]) will pick you up on Wednesday, 02/24/18 at 7AM to begin Peer Support Services with [facility]. Thank you.  Contact information: [place] [address] [phone]           Follow-up recommendations: [facility]   Signed: Marylin Crosby, MD 02/23/2018, 9:44 AM

## 2018-02-23 NOTE — Progress Notes (Signed)
  [facility] Adult Case Management Discharge Plan :  Will you be returning to the same living situation after discharge:  Yes,  returning home. At discharge, do you have transportation home?: Yes,  pt's mother Do you have the ability to pay for your medications: Yes,  insurance  Release of information consent forms completed and in the chart;  Patient's signature needed at discharge.  Patient to Follow up at: Follow-up Information    [facility] on 02/24/2018.   Why:  Unk Pinto (Peer Support Specialist with [facility]) will pick you up on Wednesday, 02/24/18 at 7AM to begin Peer Support Services with [facility]. Thank you.  Contact information: [address] [place] [address] [phone]           Next level of care provider has access to [facility] Link:no  Safety Planning and Suicide Prevention discussed: Yes,  with pt and his mother.  Have you used any form of tobacco in the last 30 days? (Cigarettes, Smokeless Tobacco, Cigars, and/or Pipes): No  Has patient been referred to the Quitline?: N/A patient is not a smoker  Patient has been referred for addiction treatment: N/A  Heidi Dach, LCSW 02/23/2018, 8:53 AM

## 2018-10-16 ENCOUNTER — Other Ambulatory Visit: Payer: Self-pay

## 2018-10-16 ENCOUNTER — Encounter: Payer: Self-pay | Admitting: Emergency Medicine

## 2018-10-16 ENCOUNTER — Emergency Department
Admission: EM | Admit: 2018-10-16 | Discharge: 2018-10-16 | Disposition: A | Discharge status: Home / Self Care | Payer: Self-pay | Attending: Emergency Medicine | Admitting: Emergency Medicine

## 2018-10-16 DIAGNOSIS — Z87891 Personal history of nicotine dependence: Secondary | ICD-10-CM | POA: Insufficient documentation

## 2018-10-16 DIAGNOSIS — F329 Major depressive disorder, single episode, unspecified: Secondary | ICD-10-CM | POA: Insufficient documentation

## 2018-10-16 DIAGNOSIS — B349 Viral infection, unspecified: Secondary | ICD-10-CM | POA: Insufficient documentation

## 2018-10-16 DIAGNOSIS — F1220 Cannabis dependence, uncomplicated: Secondary | ICD-10-CM | POA: Insufficient documentation

## 2018-10-16 DIAGNOSIS — F4323 Adjustment disorder with mixed anxiety and depressed mood: Secondary | ICD-10-CM | POA: Insufficient documentation

## 2018-10-16 LAB — INFLUENZA PANEL BY PCR (TYPE A & B)
Influenza A By PCR: NEGATIVE
Influenza B By PCR: NEGATIVE

## 2018-10-16 LAB — GROUP A STREP BY PCR: Group A Strep by PCR: NOT DETECTED

## 2018-10-16 LAB — MONONUCLEOSIS SCREEN: MONO SCREEN: NEGATIVE

## 2018-10-16 NOTE — Discharge Instructions (Addendum)
Follow-up with your regular doctor if not better in 3 to 5 days.  Return emergency department worsening.

## 2018-10-16 NOTE — ED Provider Notes (Signed)
[facility] Emergency Department Provider Note  ____________________________________________   First MD Initiated Contact with Patient 10/16/18 1148     (approximate)  I have reviewed the triage vital signs and the nursing notes.   HISTORY  Chief Complaint Generalized Body Aches    HPI Erik Raymond is a 33 y.o. maleflulike symptoms, patient is complained of questionable fever, body aches.  Denies cough, complains sore throat, denies vomiting, denies diarrhea; denies chest pain or sob.  Sx for 1 days   Past Medical History:  Diagnosis Date  . 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 Adjustment disorder with mixed anxiety and depressed mood 02/23/2018  . OCD (obsessive compulsive disorder) 02/19/2018  . Cannabis use disorder, moderate, dependence (HCC) 02/19/2018    History reviewed. No pertinent surgical history.  Prior to Admission medications   Not on File    Allergies Patient has no known allergies.  No family history on file.  Social History Social History   Tobacco Use  . Smoking status: Former Smoker    Types: Cigarettes  . Smokeless tobacco: Never Used  Substance Use Topics  . Alcohol use: Yes    Comment: occ  . Drug use: No    Review of Systems  Constitutional: Positive fever and body aches Eyes: No visual changes. ENT: Positive sore throat. Respiratory: Denies cough Genitourinary: Negative for dysuria. Musculoskeletal: Negative for back pain. Skin: Negative for rash.    ____________________________________________   PHYSICAL EXAM:  VITAL SIGNS: ED Triage Vitals [10/16/18 1140]  Enc Vitals Group     BP (!) 151/77     Pulse Rate 89     Resp 18     Temp 98.9 F (37.2 C)     Temp Source Oral     SpO2 99 %     Weight 180 lb (81.6 kg)     Height 5\' 10"  (1.778 m)     Head Circumference    Peak Flow      Pain Score 8     Pain Loc      Pain Edu?      Excl. in GC?     Constitutional: Alert and oriented. Well appearing and in no acute distress. Eyes: Conjunctivae are normal.  Head: Atraumatic. Nose: No congestion/rhinnorhea. Mouth/Throat: Mucous membranes are moist.  It is mildly red Neck:  supple no lymphadenopathy noted Cardiovascular: Normal rate, regular rhythm. Heart sounds are normal Respiratory: Normal respiratory effort.  No retractions, lungs c t a  Abd: soft nontender bs normal all 4 quad GU: deferred Musculoskeletal: FROM all extremities, warm and well perfused Neurologic:  Normal speech and language.  Skin:  Skin is warm, dry and intact. No rash noted. Psychiatric: Mood and affect are normal. Speech and behavior are normal.  ____________________________________________   LABS (all labs ordered are listed, but only abnormal results are displayed)  Labs Reviewed  GROUP A STREP BY PCR  INFLUENZA PANEL BY PCR (TYPE A & B)  MONONUCLEOSIS SCREEN   ____________________________________________   ____________________________________________  RADIOLOGY    ____________________________________________   PROCEDURES  Procedure(s) performed: No  Procedures    ____________________________________________   INITIAL IMPRESSION / ASSESSMENT AND PLAN / ED COURSE  Pertinent labs & imaging results that were available during my care of the patient were reviewed by me and considered in my medical decision making (see chart for details).   Patient  is 33 year old male presents emergency department with body aches and sore throat.  Physical exam shows a afebrile patient.  Throat is mildly red.  Remainder exam is unremarkable  Flu, strep, and mono test are all negative  Explained findings to the patient.  Discussed viral illnesses with the patient.  He is to follow with regular doctor if not better in 3 days.  Return emergency department worsening.  States  he understands will comply appears discharged stable condition.     As part of my medical decision making, I reviewed the following data within the electronic MEDICAL RECORD NUMBER Nursing notes reviewed and incorporated, Labs reviewed flu, strep, mono negative, Old chart reviewed, Notes from prior ED visits and [place] Controlled Substance Database  ____________________________________________   FINAL CLINICAL IMPRESSION(S) / ED DIAGNOSES  Final diagnoses:  Viral illness      NEW MEDICATIONS STARTED DURING THIS VISIT:  New Prescriptions   No medications on file     Note:  This document was prepared using Dragon voice recognition software and may include unintentional dictation errors.    Faythe GheeFisher,  W, PA-C 10/16/18 1300    Minna AntisPaduchowski, Kevin, MD 10/16/18 [phone]

## 2018-10-16 NOTE — ED Triage Notes (Signed)
Pt arrives with complaints of generalized body aches and headache that started yesterday. Pt reports using OTC medication use with minimal relief. PT in NAD

## 2019-05-17 ENCOUNTER — Encounter: Payer: Self-pay | Admitting: Medical Oncology

## 2019-05-17 ENCOUNTER — Other Ambulatory Visit: Payer: Self-pay

## 2019-05-17 ENCOUNTER — Emergency Department
Admission: EM | Admit: 2019-05-17 | Discharge: 2019-05-17 | Disposition: A | Discharge status: Home / Self Care | Payer: Self-pay | Attending: Student | Admitting: Student

## 2019-05-17 DIAGNOSIS — H1032 Unspecified acute conjunctivitis, left eye: Secondary | ICD-10-CM | POA: Insufficient documentation

## 2019-05-17 DIAGNOSIS — H5789 Other specified disorders of eye and adnexa: Secondary | ICD-10-CM

## 2019-05-17 DIAGNOSIS — Z87891 Personal history of nicotine dependence: Secondary | ICD-10-CM | POA: Insufficient documentation

## 2019-05-17 MED ORDER — ERYTHROMYCIN 5 MG/GM OP OINT: 1.0000 "application " | TOPICAL_OINTMENT | Freq: Four times a day (QID) | OPHTHALMIC | 0 refills | Status: AC

## 2019-05-17 MED ORDER — KETOROLAC TROMETHAMINE 0.5 % OP SOLN: 1.0000 [drp] | Freq: Four times a day (QID) | OPHTHALMIC | 0 refills | Status: AC

## 2019-05-17 MED ORDER — ERYTHROMYCIN 5 MG/GM OP OINT: 1.0000 "application " | TOPICAL_OINTMENT | Freq: Four times a day (QID) | OPHTHALMIC | 0 refills | Status: DC

## 2019-05-17 MED ORDER — FLUORESCEIN SODIUM 1 MG OP STRP
1.0000 | ORAL_STRIP | Freq: Once | OPHTHALMIC | Status: AC
  Administered 2019-05-17: 1 via OPHTHALMIC
  Filled 2019-05-17: qty 1

## 2019-05-17 MED ORDER — TETRACAINE HCL 0.5 % OP SOLN
2.0000 [drp] | Freq: Once | OPHTHALMIC | Status: AC
  Administered 2019-05-17: 12:00:00 2 [drp] via OPHTHALMIC
  Filled 2019-05-17: qty 4

## 2019-05-17 NOTE — ED Triage Notes (Signed)
Pt reports left eye irritation since taking contact out last night.

## 2019-05-17 NOTE — ED Provider Notes (Signed)
[facility] Emergency Department Provider Note ____________________________________________  Time seen: 1133  I have reviewed the triage vital signs and the nursing notes.  HISTORY  Chief Complaint  Eye Problem  History and exam performed by Darci NeedleMisha Raza, PA-S [facility]  HPI Erik Raymond is a 33 y.o. male presents to the ED for evaluation of left eye irritation. He notes 2 days ago that he experienced a foreign body sensation (FBS) while at work. He also noted irritation when he removed his monthly contact lens. He notes tearing, light sensitivity and blurry vision. He is not currently wearing his contact lens on the left. He denies any matting, crusting, nausea, or vomiting.    Past Medical History:  Diagnosis Date  . 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 Adjustment disorder with mixed anxiety and depressed mood 02/23/2018  . OCD (obsessive compulsive disorder) 02/19/2018  . Cannabis use disorder, moderate, dependence (HCC) 02/19/2018    History reviewed. No pertinent surgical history.  Prior to Admission medications   Medication Sig Start Date End Date Taking? Authorizing Provider  erythromycin ophthalmic ointment Place 1 application into the left eye 4 (four) times daily for 7 days. 05/17/19 05/24/19  Lucetta Baehr, Charlesetta IvoryJenise V Bacon, PA-C  ketorolac (ACULAR) 0.5 % ophthalmic solution Place 1 drop into the left eye 4 (four) times daily for 7 days. 05/17/19 05/24/19  Shawnita Krizek, Charlesetta IvoryJenise V Bacon, PA-C    Allergies Patient has no known allergies.  No family history on file.  Social History Social History   Tobacco Use  . Smoking status: Former Smoker    Types: Cigarettes  . Smokeless tobacco: Never Used  Substance Use Topics  . Alcohol use: Yes    Comment: occ  . Drug use: No    Review of Systems  Constitutional: Negative for fever. Eyes:  Negative for visual changes. Left eye irritation and FBS.  ENT: Negative for sore throat. Cardiovascular: Negative for chest pain. Respiratory: Negative for shortness of breath. Gastrointestinal: Negative for abdominal pain, vomiting and diarrhea. Genitourinary: Negative for dysuria. Musculoskeletal: Negative for back pain. Skin: Negative for rash. Skin lesion as above.  Neurological: Negative for headaches, focal weakness or numbness. ____________________________________________  PHYSICAL EXAM:  VITAL SIGNS: ED Triage Vitals  Enc Vitals Group     BP 05/17/19 1109 138/89     Pulse Rate 05/17/19 1108 81     Resp 05/17/19 1108 18     Temp 05/17/19 1108 98.7 F (37.1 C)     Temp Source 05/17/19 1108 Oral     SpO2 05/17/19 1108 98 %     Weight 05/17/19 1107 170 lb (77.1 kg)     Height 05/17/19 1107 5\' 10"  (1.778 m)     Head Circumference --      Peak Flow --      Pain Score 05/17/19 1107 10     Pain Loc --      Pain Edu? --      Excl. in GC? --     Constitutional: Alert and oriented. Well appearing and in no distress. Head: Normocephalic and atraumatic. Eyes: Conjunctivae are injected on the left. Normal extraocular movements. No gross foreign body noted. Lids everted for exam. No fluorescein dye uptake to the cornea. Lower lid with some dye uptake.  Hematological/Lymphatic/Immunological: No preauricular lymphadenopathy. Cardiovascular: Normal rate, regular rhythm. Normal distal pulses. Respiratory: Normal respiratory effort.  Musculoskeletal: Nontender  with normal range of motion in all extremities.  Neurologic:  Normal gait without ataxia. Normal speech and language. No gross focal neurologic deficits are appreciated. Skin:  Skin is warm, dry and intact. No rash noted. Patient  ____________________________________________  PROCEDURES  Procedures   Visual Acuity  Right Eye Distance:  20/40 Left Eye Distance:  20/200 uncorrected Bilateral Distance:    ____________________________________________  INITIAL IMPRESSION / ASSESSMENT AND PLAN / ED COURSE  Erik Raymond was evaluated in Emergency Department on 05/17/2019 for the symptoms described in the history of present illness. He was evaluated in the context of the global COVID-19 pandemic, which necessitated consideration that the patient might be at risk for infection with the SARS-CoV-2 virus that causes COVID-19. Institutional protocols and algorithms that pertain to the evaluation of patients at risk for COVID-19 are in a state of rapid change based on information released by regulatory bodies including the CDC and federal and state organizations. These policies and algorithms were followed during the patient's care in the ED.  Patient with ED evaluation of left eye irritation and FBS. He is being treated for conjunctivitis with erythromycin ointment and ketorolac ophthalmic solution. He will follow-up [facility] or return to the ED as needed.  ____________________________________________  FINAL CLINICAL IMPRESSION(S) / ED DIAGNOSES  Final diagnoses:  Acute conjunctivitis of left eye, unspecified acute conjunctivitis type  Eye irritation      Carmie End, Dannielle Karvonen, PA-C 05/17/19 1258    Lilia Pro., MD 05/17/19 1700

## 2019-05-17 NOTE — Discharge Instructions (Signed)
Your exam is consistent with a conjunctivitis. This may be due to irritation from debris or from a previous scratch to the cornea. Use the eye drops and ointment as directed. Consider cleansing your monthly contact lenses, at least every 3 days to prevent irritation and infections. Follow-up with your eye care provider or return as discussed.

## 2019-06-24 ENCOUNTER — Other Ambulatory Visit: Payer: Self-pay

## 2019-06-24 ENCOUNTER — Encounter: Payer: Self-pay | Admitting: Family Medicine

## 2019-06-24 ENCOUNTER — Ambulatory Visit: Payer: Self-pay | Admitting: Family Medicine

## 2019-06-24 DIAGNOSIS — R591 Generalized enlarged lymph nodes: Secondary | ICD-10-CM

## 2019-06-24 DIAGNOSIS — F39 Unspecified mood [affective] disorder: Secondary | ICD-10-CM

## 2019-06-24 DIAGNOSIS — Z113 Encounter for screening for infections with a predominantly sexual mode of transmission: Secondary | ICD-10-CM

## 2019-06-24 DIAGNOSIS — R634 Abnormal weight loss: Secondary | ICD-10-CM | POA: Insufficient documentation

## 2019-06-24 MED ORDER — AZITHROMYCIN 500 MG PO TABS
1000.0000 mg | ORAL_TABLET | Freq: Once | ORAL | Status: AC
  Administered 2019-06-24: 17:00:00 1000 mg via ORAL

## 2019-06-24 NOTE — Progress Notes (Signed)
Patient denies sx's. Would like STD screen with bloodwork. Aileen Fass, RN

## 2019-06-24 NOTE — Progress Notes (Signed)
STI clinic/screening visit  Subjective:  Erik Raymond is a 33 y.o. male being seen today for an STI screening visit. The patient reports they do not have symptoms.  Patient has the following medical conditions:   Patient Active Problem List   Diagnosis Date Noted  . Lymphadenopathy 06/26/2019  . Weight loss 06/24/2019  . Adjustment disorder with mixed anxiety and depressed mood 02/23/2018  . OCD (obsessive compulsive disorder) 02/19/2018  . Cannabis use disorder, moderate, dependence ([place]) 02/19/2018     Chief Complaint  Patient presents with  . SEXUALLY TRANSMITTED DISEASE    screening    HPI  Patient reports no sx. But report weight loss of about 40 lbs in the last 2 years.   See flowsheet for further details and programmatic requirements.    The following portions of the patient's history were reviewed and updated as appropriate: allergies, current medications, past medical history, past social history, past surgical history and problem list.  Objective:  There were no vitals filed for this visit.  Physical Exam Constitutional:      Appearance: Normal appearance.  HENT:     Head: Normocephalic and atraumatic.     Comments: No nits or hair loss    Mouth/Throat:     Mouth: Mucous membranes are moist.     Pharynx: Oropharynx is clear. No oropharyngeal exudate or posterior oropharyngeal erythema.  Pulmonary:     Effort: Pulmonary effort is normal.  Abdominal:     General: Abdomen is flat.     Palpations: Abdomen is soft. There is no hepatomegaly or mass.     Tenderness: There is no abdominal tenderness.  Genitourinary:    Pubic Area: No rash or pubic lice.      Penis: Normal.      Scrotum/Testes: Normal.     Epididymis:     Right: Normal.     Left: Normal.     Rectum: Normal.  Lymphadenopathy:     Head:     Right side of head: No preauricular or posterior auricular adenopathy.     Left side of head: No preauricular or posterior auricular  adenopathy.     Cervical: Cervical adenopathy (bilateral, non-tender, enlarged all along anterior cervical chain. ) present.     Upper Body:     Right upper body: No supraclavicular or axillary adenopathy.     Left upper body: No supraclavicular or axillary adenopathy.     Lower Body: Right inguinal adenopathy (non tender) present. Left inguinal adenopathy (non tender) present.  Skin:    General: Skin is warm and dry.     Findings: No rash.  Neurological:     Mental Status: He is alert and oriented to person, place, and time.       Assessment and Plan:  Erik Raymond is a 33 y.o. male presenting to the [facility] Department for STI screening  1. Screening for venereal disease Accepts all screenings today- Hep B and C based on risk factors.  - HIV/HCV [facility] - Syphilis Serology, [facility] - Hepatitis Serology, [facility] - Ct, Ng, Mycoplasmas NAA, Urine  2. Weight loss Reports some intentional weight loss but has lost 40 lb in 2 yrs which is more than he wanted.  I am concerned about the LAD combined with weight loss. Await STI screenings. If negative strongly recommend CBC and referral to have lymph node biopsy as lymphoma would be on differential  3. Lymphadenopathy Bilateral non-tender  LAD in the groin and cervical locations concerning. Will trt empirically for NGU. This is associated with weight loss and if STI screenings are negative strongly recommend evaluation for possible lymph bx.  - azithromycin (ZITHROMAX) tablet 1,000 mg  4. Mood disorder [facility](HCC) Patient endorsed significant depression sx. Denies SI. Reports he used to go to the gym to help curb sx but unable to do this. He is working out at home but also having financial trouble with lack of hours at work. There are definite stressors and patient also reports even prior to covid he had depressive sx. He has seen a counselor before. He is currently using MJ to trt sx but reports he does  want to stop.  - [facility]   Return if symptoms worsen or fail to improve.  No future appointments.  Federico FlakeKimberly Niles Markeith Jue, MD

## 2019-06-26 ENCOUNTER — Encounter: Payer: Self-pay | Admitting: Family Medicine

## 2019-06-26 DIAGNOSIS — R591 Generalized enlarged lymph nodes: Secondary | ICD-10-CM | POA: Insufficient documentation

## 2019-06-29 LAB — CT, NG, MYCOPLASMAS NAA, URINE
Chlamydia trachomatis, NAA: NEGATIVE
Mycoplasma genitalium NAA: NEGATIVE
Mycoplasma hominis NAA: NEGATIVE
Neisseria gonorrhoeae, NAA: NEGATIVE
Ureaplasma spp NAA: POSITIVE — AB

## 2019-07-01 LAB — HM HEPATITIS C SCREENING LAB: HM Hepatitis Screen: NEGATIVE

## 2019-07-01 LAB — HM HIV SCREENING LAB: HM HIV Screening: POSITIVE

## 2019-07-01 LAB — HEPATITIS B SURFACE ANTIGEN

## 2019-07-04 ENCOUNTER — Telehealth: Payer: Self-pay

## 2019-07-04 DIAGNOSIS — A539 Syphilis, unspecified: Secondary | ICD-10-CM | POA: Insufficient documentation

## 2019-07-04 DIAGNOSIS — Z21 Asymptomatic human immunodeficiency virus [HIV] infection status: Secondary | ICD-10-CM | POA: Insufficient documentation

## 2019-07-04 NOTE — Telephone Encounter (Signed)
TC with Kristine Garbe at [facility]; patient aware of +HIV/+Syphilis. Patient has appt tomorrow at [facility] to begin paperwork process for treatment.  Kristine Garbe will f/u with RN if patient needs Syphilis tx here at the health dept. Aileen Fass, RN

## 2019-07-05 ENCOUNTER — Other Ambulatory Visit: Payer: Self-pay

## 2019-07-05 ENCOUNTER — Ambulatory Visit: Payer: Self-pay | Admitting: Family Medicine

## 2019-07-05 ENCOUNTER — Encounter: Payer: Self-pay | Admitting: Family Medicine

## 2019-07-05 DIAGNOSIS — Z09 Encounter for follow-up examination after completed treatment for conditions other than malignant neoplasm: Secondary | ICD-10-CM

## 2019-07-05 DIAGNOSIS — B20 Human immunodeficiency virus [HIV] disease: Secondary | ICD-10-CM

## 2019-07-05 DIAGNOSIS — A539 Syphilis, unspecified: Secondary | ICD-10-CM

## 2019-07-05 MED ORDER — PENICILLIN G BENZATHINE 1200000 UNIT/2ML IM SUSP
2.4000 10*6.[IU] | INTRAMUSCULAR | Status: AC
  Administered 2019-07-05 – 2019-07-19 (×3): 2.4 10*6.[IU] via INTRAMUSCULAR

## 2019-07-05 NOTE — Progress Notes (Signed)
S: Client here today for treatment for syphilis.  H has recently found out he is HIV +.  He states that he is in disbelief that he has HIV   States that his mother is his support person.  He contacted [facility] yesterday to make an appt for their services.  A: Syphilis      HIV +-Recent diagnosis  O: PE-See visit 06/24/2019  Plan:  Bicillin 2.4 MU  IM x 3 weeks.First injection given  Today. Client has contacted [facility] for coordination of his recent HIV diagnosis.     Client has an appt today for evaluation.

## 2019-07-05 NOTE — Progress Notes (Signed)
Patient states he is here for Syphilis treatment and was told he had contracted HIV.Marland KitchenJenetta Downer, RN

## 2019-07-12 ENCOUNTER — Ambulatory Visit: Payer: Self-pay | Admitting: Family Medicine

## 2019-07-12 ENCOUNTER — Other Ambulatory Visit: Payer: Self-pay

## 2019-07-12 DIAGNOSIS — A539 Syphilis, unspecified: Secondary | ICD-10-CM

## 2019-07-12 NOTE — Progress Notes (Signed)
Patient here for 2nd set Bicillin shots. See order from 07/05/2019 from C. Marzetta Board. Patient scheduled to return for 3rd set Bicillin injections on 07/19/2019.Marland KitchenJenetta Downer, RN

## 2019-07-12 NOTE — Progress Notes (Signed)
Client not seen by provider- here for syphilis #2 treatment,

## 2019-07-19 ENCOUNTER — Ambulatory Visit: Payer: Self-pay | Admitting: Family Medicine

## 2019-07-19 ENCOUNTER — Other Ambulatory Visit: Payer: Self-pay

## 2019-07-19 DIAGNOSIS — A539 Syphilis, unspecified: Secondary | ICD-10-CM

## 2019-07-19 NOTE — Progress Notes (Signed)
Patient in clinic for 3rd set of 3 sets Bicillin injections. Patient denies issues with past administrations of Bicillin. Patient counseled no sex for 2 weeks after today, and need for blood draw to check titers in 6 months. Patient states understanding and denies questions at this time.Jenetta Downer, RN

## 2019-07-21 ENCOUNTER — Ambulatory Visit: Payer: Self-pay | Admitting: Licensed Clinical Social Worker

## 2020-07-07 ENCOUNTER — Encounter: Payer: Self-pay | Admitting: Emergency Medicine

## 2020-07-07 ENCOUNTER — Other Ambulatory Visit: Payer: Self-pay

## 2020-07-07 ENCOUNTER — Emergency Department
Admission: EM | Admit: 2020-07-07 | Discharge: 2020-07-07 | Disposition: A | Discharge status: Home / Self Care | Payer: BC Managed Care – PPO | Attending: Emergency Medicine | Admitting: Emergency Medicine

## 2020-07-07 ENCOUNTER — Emergency Department: Payer: BC Managed Care – PPO

## 2020-07-07 DIAGNOSIS — W108XXA Fall (on) (from) other stairs and steps, initial encounter: Secondary | ICD-10-CM | POA: Diagnosis not present

## 2020-07-07 DIAGNOSIS — Z87891 Personal history of nicotine dependence: Secondary | ICD-10-CM | POA: Insufficient documentation

## 2020-07-07 DIAGNOSIS — Y9301 Activity, walking, marching and hiking: Secondary | ICD-10-CM | POA: Insufficient documentation

## 2020-07-07 DIAGNOSIS — S299XXA Unspecified injury of thorax, initial encounter: Secondary | ICD-10-CM | POA: Diagnosis present

## 2020-07-07 DIAGNOSIS — S20212A Contusion of left front wall of thorax, initial encounter: Secondary | ICD-10-CM | POA: Insufficient documentation

## 2020-07-07 DIAGNOSIS — Y92009 Unspecified place in unspecified non-institutional (private) residence as the place of occurrence of the external cause: Secondary | ICD-10-CM | POA: Insufficient documentation

## 2020-07-07 MED ORDER — IBUPROFEN 600 MG PO TABS: 600.0000 mg | ORAL_TABLET | Freq: Three times a day (TID) | ORAL | 0 refills | Status: AC | PRN

## 2020-07-07 MED ORDER — CYCLOBENZAPRINE HCL 10 MG PO TABS: 10.0000 mg | ORAL_TABLET | Freq: Three times a day (TID) | ORAL | 0 refills | Status: AC | PRN

## 2020-07-07 MED ORDER — LIDOCAINE 5 % EX PTCH
1.0000 | MEDICATED_PATCH | CUTANEOUS | Status: DC
  Administered 2020-07-07: 1 via TRANSDERMAL
  Filled 2020-07-07: qty 1

## 2020-07-07 MED ORDER — TRAMADOL HCL 50 MG PO TABS: 50.0000 mg | ORAL_TABLET | Freq: Four times a day (QID) | ORAL | 0 refills | Status: AC | PRN

## 2020-07-07 NOTE — ED Provider Notes (Signed)
[facility] Emergency Department Provider Note   ____________________________________________   First MD Initiated Contact with Patient 07/07/20 [phone]     (approximate)  I have reviewed the triage vital signs and the nursing notes.   HISTORY  Chief Complaint Fall    HPI Erik Raymond is a 34 y.o. male patient complain of left lateral rib pain secondary to a slip and fall this morning.  Patient states he was walking on the steps when he fell landing on his left side.  Patient the pain increased with deep inspiration.  Patient rates pain as a 10/10.  Patient described the pain as "achy".  No palliative measure prior to arrival.         Past Medical History:  Diagnosis Date  . 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 HIV (human immunodeficiency virus infection) (HCC) 07/05/2019  . HIV positive (HCC) 07/04/2019  . Syphilis 07/04/2019  . Lymphadenopathy 06/26/2019  . Weight loss 06/24/2019  . Adjustment disorder with mixed anxiety and depressed mood 02/23/2018  . OCD (obsessive compulsive disorder) 02/19/2018  . Cannabis use disorder, moderate, dependence (HCC) 02/19/2018    History reviewed. No pertinent surgical history.  Prior to Admission medications   Medication Sig Start Date End Date Taking? Authorizing Provider  cyclobenzaprine (FLEXERIL) 10 MG tablet Take 1 tablet (10 mg total) by mouth 3 (three) times daily as needed. 07/07/20   Joni Reining, PA-C  ibuprofen (ADVIL) 600 MG tablet Take 1 tablet (600 mg total) by mouth every 8 (eight) hours as needed. 07/07/20   Joni Reining, PA-C  traMADol (ULTRAM) 50 MG tablet Take 1 tablet (50 mg total) by mouth every 6 (six) hours as needed for moderate pain. 07/07/20   Joni Reining, PA-C    Allergies Patient has no known allergies.  No family history on file.  Social  History Social History   Tobacco Use  . Smoking status: Former Smoker    Types: Cigarettes  . Smokeless tobacco: Never Used  Substance Use Topics  . Alcohol use: Yes    Comment: occ  . Drug use: No    Review of Systems  Constitutional: No fever/chills Eyes: No visual changes. ENT: No sore throat. Cardiovascular: Denies chest pain. Respiratory: Denies shortness of breath. Gastrointestinal: No abdominal pain.  No nausea, no vomiting.  No diarrhea.  No constipation. Genitourinary: Negative for dysuria. Musculoskeletal: Negative for back pain. Skin: Negative for rash. Neurological: Negative for headaches, focal weakness or numbness. Psychiatric:  Adjustment disorder, anxiety, cannabis use, depression, and OCD. Endocrine:  Hematological/Lymphatic:  Allergic/Immunilogical: HIV  ____________________________________________   PHYSICAL EXAM:  VITAL SIGNS: ED Triage Vitals  Enc Vitals Group     BP 07/07/20 0810 123/86     Pulse Rate 07/07/20 0810 76     Resp 07/07/20 0810 (!) 22     Temp 07/07/20 0810 98.1 F (36.7 C)     Temp Source 07/07/20 0810 Oral     SpO2 07/07/20 0810 97 %     Weight --      Height 07/07/20 0811 5\' 10"  (1.778 m)     Head Circumference --      Peak Flow --      Pain Score 07/07/20 0811 10     Pain Loc --      Pain Edu? --      Excl. in GC? --  Constitutional: Alert and oriented. Well appearing and in no acute distress. Cardiovascular: Normal rate, regular rhythm. Grossly normal heart sounds.  Good peripheral circulation. Respiratory: Normal respiratory effort.  No retractions. Lungs CTAB. Gastrointestinal: Soft and nontender. No distention. No abdominal bruits. No CVA tenderness. Genitourinary: Deferred Musculoskeletal: No chest wall deformity.  Moderate guarding palpation ribs 9 through 11 on the left side.. Neurologic:  Normal speech and language. No gross focal neurologic deficits are appreciated. No gait instability. Skin:  Skin is warm,  dry and intact. No rash noted.  No abrasion or ecchymosis. Psychiatric: Mood and affect are normal. Speech and behavior are normal.  ____________________________________________   LABS (all labs ordered are listed, but only abnormal results are displayed)  Labs Reviewed - No data to display ____________________________________________  EKG   ____________________________________________  RADIOLOGY  ED MD interpretation: Labs are no acute findings on repeat chest x-ray. Official radiology report(s): DG Ribs Unilateral W/Chest Left  Result Date: 07/07/2020 CLINICAL DATA:  Fall landing on left side.  Left rib pain EXAM: LEFT RIBS AND CHEST - 3+ VIEW COMPARISON:  02/12/2016 chest x-ray FINDINGS: No fracture or other bone lesions are seen involving the ribs. There is no evidence of pneumothorax or pleural effusion. Both lungs are clear. Heart size and mediastinal contours are within normal limits. IMPRESSION: Negative. Electronically Signed   By: Marnee Spring M.D.   On: 07/07/2020 09:18    ____________________________________________   PROCEDURES  Procedure(s) performed (including Critical Care):  Procedures   ____________________________________________   INITIAL IMPRESSION / ASSESSMENT AND PLAN / ED COURSE  As part of my medical decision making, I reviewed the following data within the electronic MEDICAL RECORD NUMBER     Patient presents with left rib pain secondary to a fall.  Discussed no acute findings on chest x-ray with patient.  Patient complaint and physical exam consistent with rib contusion.  Patient given discharge care instruction advised take medication as directed.  Patient [facility]          ____________________________________________   FINAL CLINICAL IMPRESSION(S) / ED DIAGNOSES  Final diagnoses:  Rib contusion, left, initial encounter  Fall in home, initial encounter     ED Discharge Orders         Ordered     traMADol (ULTRAM) 50 MG tablet  Every 6 hours PRN        07/07/20 0933    ibuprofen (ADVIL) 600 MG tablet  Every 8 hours PRN        07/07/20 0933    cyclobenzaprine (FLEXERIL) 10 MG tablet  3 times daily PRN        07/07/20 0933          *Please note:  Roe Rutherford was evaluated in Emergency Department on 07/07/2020 for the symptoms described in the history of present illness. He was evaluated in the context of the global COVID-19 pandemic, which necessitated consideration that the patient might be at risk for infection with the SARS-CoV-2 virus that causes COVID-19. Institutional protocols and algorithms that pertain to the evaluation of patients at risk for COVID-19 are in a state of rapid change based on information released by regulatory bodies including the CDC and federal and state organizations. These policies and algorithms were followed during the patient's care in the ED.  Some ED evaluations and interventions may be delayed as a result of limited staffing during and the pandemic.*   Note:  This document was prepared using Dragon voice  recognition software and may include unintentional dictation errors.    Joni Reining, PA-C 07/07/20 [id number]    Jene Every, MD 07/07/20 1120

## 2020-07-07 NOTE — ED Triage Notes (Signed)
Pt to ED via POV, pt states that he was walking up the steps this morning and fell landing on his left side. Pt is in having pain in the rib area. Pt states that it is very painful when breathing. Pt is in NAD.

## 2020-07-07 NOTE — Discharge Instructions (Signed)
Follow discharge care instruction take medication as directed. °

## 2022-05-20 IMAGING — CR DG RIBS W/ CHEST 3+V*L*
5 series · 5 of 5 positions shown · non-contrast
Comparison: 02/12/2016 chest x-ray

CLINICAL DATA: Fall landing on left side.  Left rib pain

EXAM:
LEFT RIBS AND CHEST - 3+ VIEW

[chest pa]
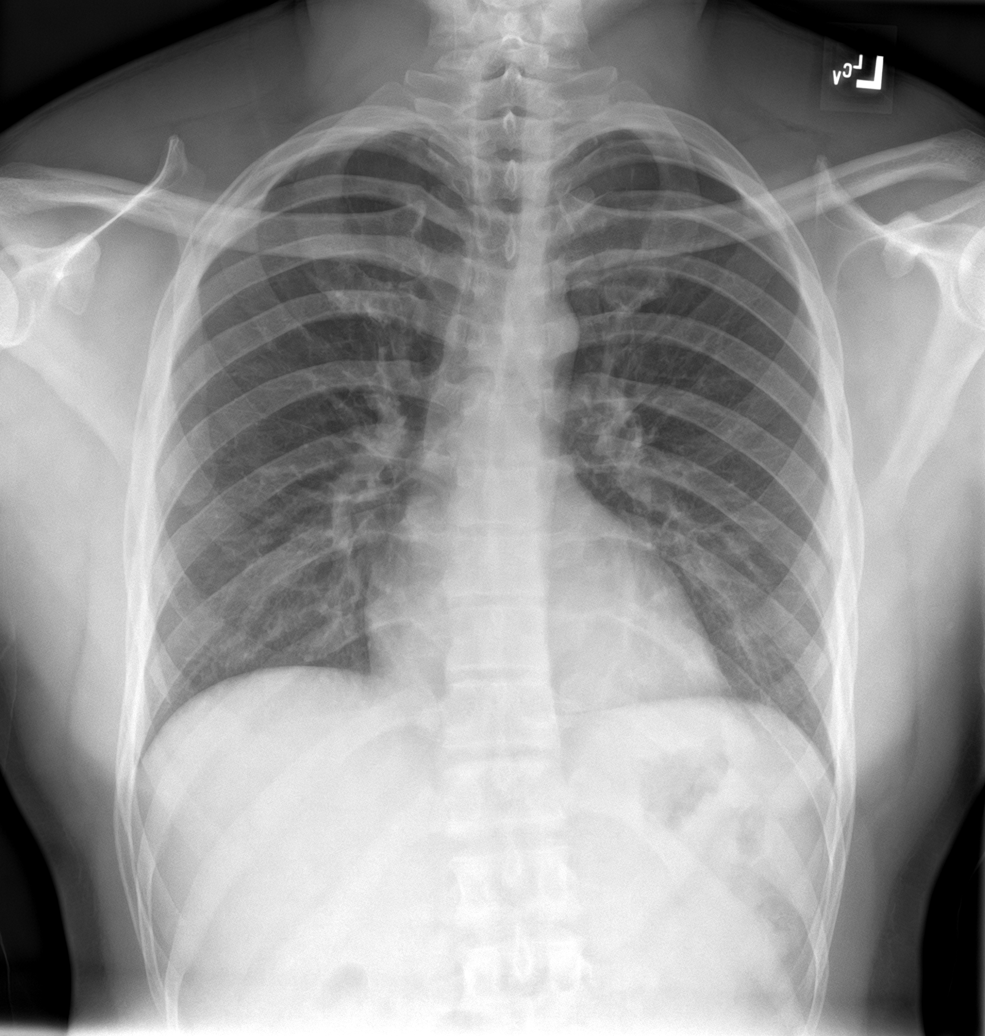

[rib pa (1 of 2)]
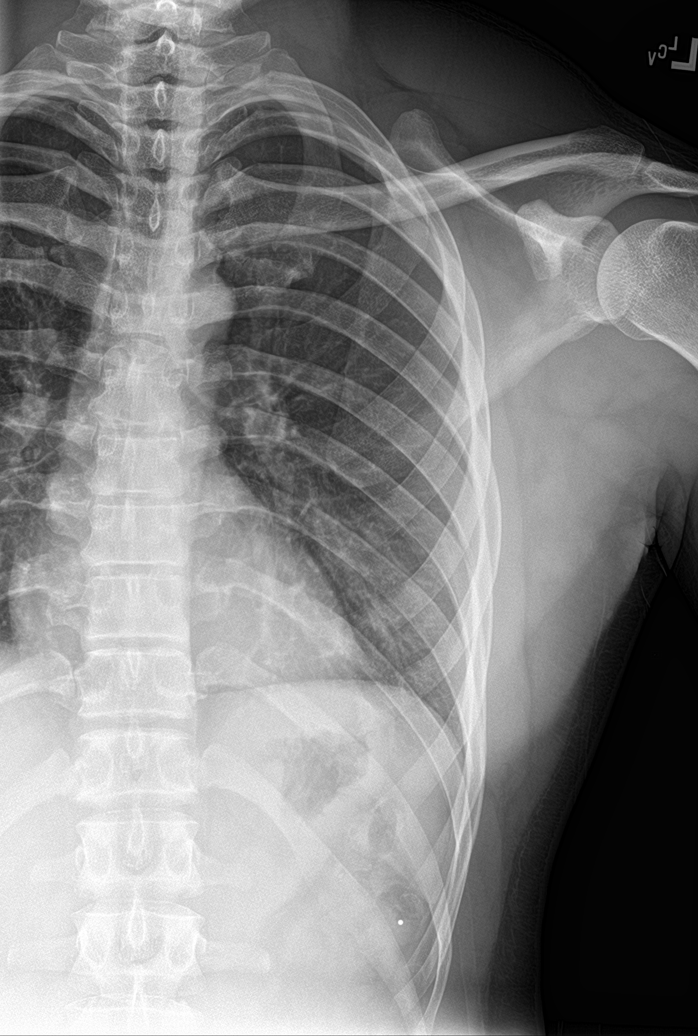

[rib pa obl (1 of 2)]
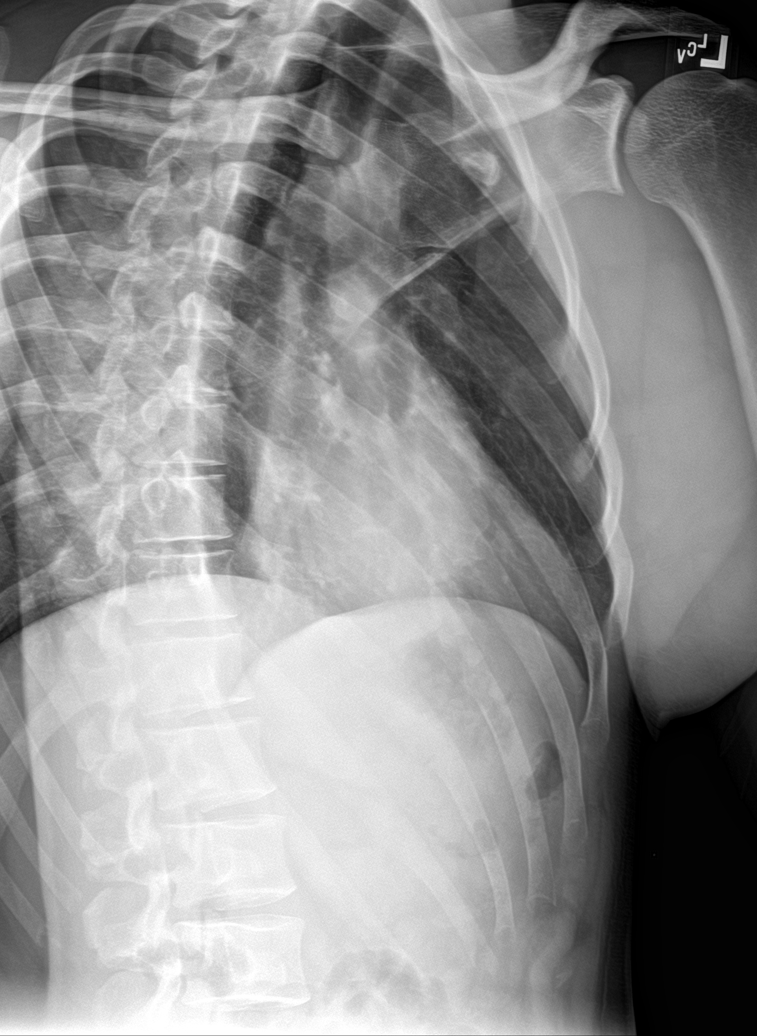

[rib pa (2 of 2)]
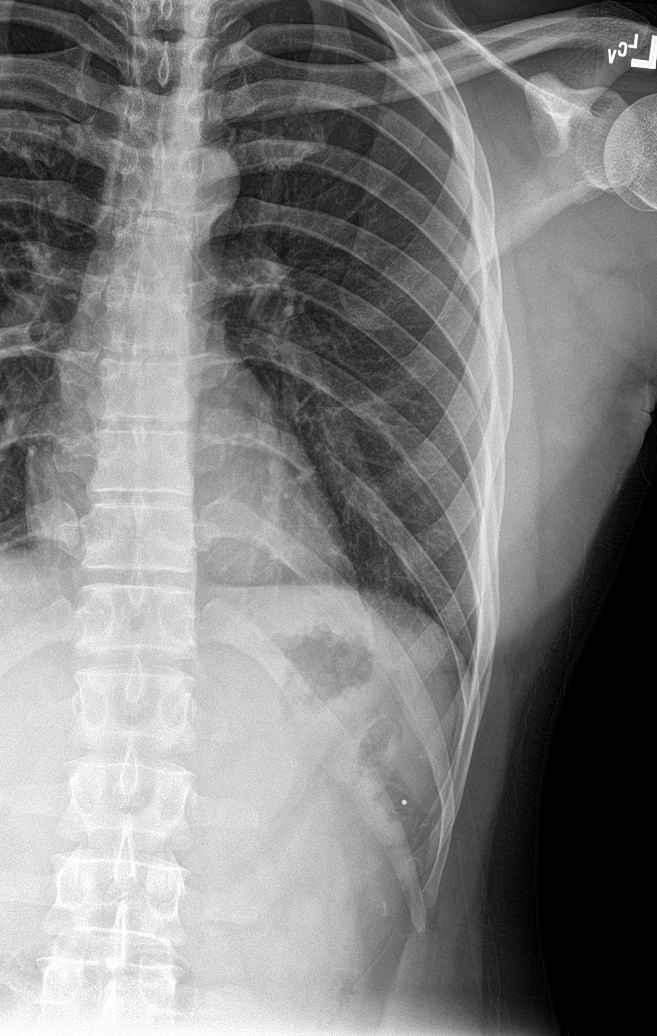

[rib pa obl (2 of 2)]
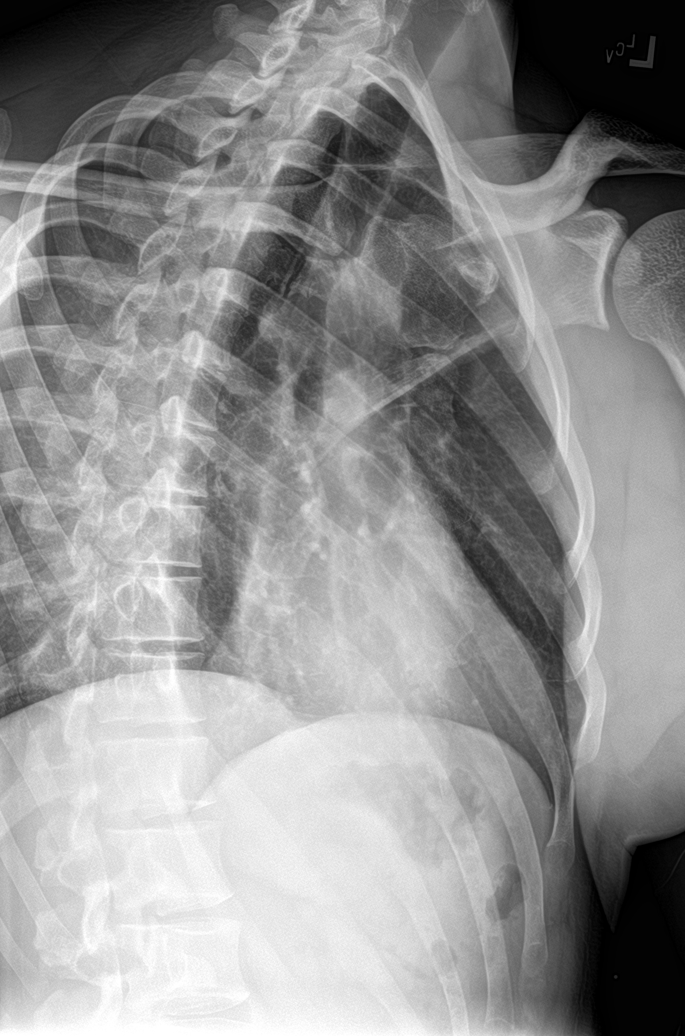

[5 of 5 positions shown; findings below may reference images not displayed]

FINDINGS: No fracture or other bone lesions are seen involving the ribs. There
is no evidence of pneumothorax or pleural effusion. Both lungs are
clear. Heart size and mediastinal contours are within normal limits.
IMPRESSION: Negative.
